# Patient Record
Sex: Male | Born: 1995 | Race: Black or African American | Hispanic: No | Marital: Single | Smoking: Current every day smoker
Health system: Southern US, Community
[De-identification: ages and names within clinical notes are randomized; demographics above are authoritative.]

## PROBLEM LIST (undated history)

## (undated) DIAGNOSIS — Z789 Other specified health status: Secondary | ICD-10-CM

## (undated) HISTORY — PX: NO PAST SURGERIES: SHX2092

---
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
## 2013-03-06 ENCOUNTER — Emergency Department (HOSPITAL_COMMUNITY)
Admission: EM | Admit: 2013-03-06 | Discharge: 2013-03-06 | Discharge status: Against Medical Advice | Payer: Medicaid Other | Attending: Emergency Medicine | Admitting: Emergency Medicine

## 2013-03-06 ENCOUNTER — Encounter (HOSPITAL_COMMUNITY): Payer: Self-pay | Admitting: Emergency Medicine

## 2013-03-06 DIAGNOSIS — IMO0001 Reserved for inherently not codable concepts without codable children: Secondary | ICD-10-CM | POA: Insufficient documentation

## 2013-03-06 DIAGNOSIS — M25539 Pain in unspecified wrist: Secondary | ICD-10-CM | POA: Insufficient documentation

## 2013-03-06 DIAGNOSIS — F17200 Nicotine dependence, unspecified, uncomplicated: Secondary | ICD-10-CM | POA: Insufficient documentation

## 2013-03-06 NOTE — ED Notes (Signed)
Called patient back to fast track x 2.

## 2013-03-06 NOTE — ED Notes (Signed)
Called patient back to FT x 1.

## 2013-03-06 NOTE — ED Notes (Signed)
Pt c/o generalized body aches and wrist pain x 3 weeks

## 2013-05-12 ENCOUNTER — Encounter (HOSPITAL_COMMUNITY): Payer: Self-pay | Admitting: Emergency Medicine

## 2013-05-12 ENCOUNTER — Emergency Department (HOSPITAL_COMMUNITY)
Admission: EM | Admit: 2013-05-12 | Discharge: 2013-05-12 | Disposition: A | Discharge status: Home / Self Care | Payer: Medicaid Other | Attending: Emergency Medicine | Admitting: Emergency Medicine

## 2013-05-12 DIAGNOSIS — F17200 Nicotine dependence, unspecified, uncomplicated: Secondary | ICD-10-CM | POA: Insufficient documentation

## 2013-05-12 DIAGNOSIS — R300 Dysuria: Secondary | ICD-10-CM | POA: Insufficient documentation

## 2013-05-12 LAB — URINALYSIS, ROUTINE W REFLEX MICROSCOPIC
Bilirubin Urine: NEGATIVE
Glucose, UA: NEGATIVE mg/dL
Ketones, ur: NEGATIVE mg/dL
Protein, ur: NEGATIVE mg/dL
pH: 7.5 (ref 5.0–8.0)

## 2013-05-12 NOTE — ED Provider Notes (Signed)
History     CSN: [id number]  Arrival date & time 05/12/13  1250   First MD Initiated Contact with Patient 05/12/13 1446      Chief Complaint  Patient presents with  . Exposure to STD    (Consider location/radiation/quality/duration/timing/severity/associated sxs/prior treatment) The history is provided by the patient.   Patient presents to ED for STD check. States he has had some tingling at the meatus. This occurs occasionally while urinating. He has unprotected sex, but is in a monogamous relationship.Denies discharge from the penis, testicular pain or swelling, genital lesions, inguinal lymphadenopathy,Fever, chills, abdominal pain, nausea, vomiting, rashes, joint swelling, visual changes, arthralgias or myalgias.  History reviewed. No pertinent past medical history.  No past surgical history on file.  No family history on file.  History  Substance Use Topics  . Smoking status: Current Every Day Smoker  . Smokeless tobacco: Not on file  . Alcohol Use: Yes     Comment: occ      Review of Systems As stated in HPI Allergies  Review of patient's allergies indicates no known allergies.  Home Medications  No current outpatient prescriptions on file.  BP 136/89  Pulse 63  Temp(Src) 98.2 F (36.8 C)  Resp 16  SpO2 99%  Physical Exam  Nursing note and vitals reviewed. Constitutional: He appears well-developed and well-nourished. No distress.  HENT:  Head: Normocephalic and atraumatic.  Eyes: Conjunctivae are normal. No scleral icterus.  Neck: Normal range of motion. Neck supple.  Cardiovascular: Normal rate, regular rhythm and normal heart sounds.   Pulmonary/Chest: Effort normal and breath sounds normal. No respiratory distress.  Abdominal: Soft. There is no tenderness. Hernia confirmed negative in the right inguinal area and confirmed negative in the left inguinal area.  Genitourinary: Testes normal. Cremasteric reflex is present. Circumcised. No hypospadias,  penile erythema or penile tenderness. No discharge found.  Musculoskeletal: He exhibits no edema.  Lymphadenopathy:       Right: No inguinal adenopathy present.       Left: No inguinal adenopathy present.  Neurological: He is alert.  Skin: Skin is warm and dry. He is not diaphoretic.  Psychiatric: His behavior is normal.    ED Course  Procedures (including critical care time)  Labs Reviewed  URINALYSIS, ROUTINE W REFLEX MICROSCOPIC - Abnormal; Notable for the following:    Urobilinogen, UA 2.0 (*)    All other components within normal limits  GC/CHLAMYDIA PROBE AMP   No results found.   1. Dysuria       MDM  Patient presents for check of STD. His Urine is negative for infection. G/C chlamydia pending. Discussed need for protection with sexual intercourse. F?U with health department for extended std testing.         Arthor Captain, PA-C 05/15/13 [phone]

## 2013-05-12 NOTE — ED Notes (Signed)
States that he has been exposed to std has occ tingle when he voids

## 2013-05-12 NOTE — ED Notes (Signed)
Patient states "nothing is wrong, I just wanted to be tested for any STD's".

## 2013-05-15 NOTE — ED Provider Notes (Signed)
Medical screening examination/treatment/procedure(s) were performed by non-physician practitioner and as supervising physician I was immediately available for consultation/collaboration.   Joya Gaskins, MD 05/15/13 2022

## 2013-11-23 ENCOUNTER — Encounter (HOSPITAL_COMMUNITY): Payer: Self-pay | Admitting: Emergency Medicine

## 2013-11-23 ENCOUNTER — Emergency Department (HOSPITAL_COMMUNITY)
Admission: EM | Admit: 2013-11-23 | Discharge: 2013-11-23 | Disposition: A | Discharge status: Home / Self Care | Payer: Medicaid Other | Attending: Emergency Medicine | Admitting: Emergency Medicine

## 2013-11-23 DIAGNOSIS — J039 Acute tonsillitis, unspecified: Secondary | ICD-10-CM | POA: Insufficient documentation

## 2013-11-23 DIAGNOSIS — F17200 Nicotine dependence, unspecified, uncomplicated: Secondary | ICD-10-CM | POA: Insufficient documentation

## 2013-11-23 LAB — RAPID STREP SCREEN (MED CTR MEBANE ONLY): Streptococcus, Group A Screen (Direct): NEGATIVE

## 2013-11-23 MED ORDER — IBUPROFEN 600 MG PO TABS: 600.0000 mg | ORAL_TABLET | Freq: Four times a day (QID) | ORAL | Status: DC | PRN

## 2013-11-23 MED ORDER — HYDROCODONE-ACETAMINOPHEN 7.5-325 MG/15ML PO SOLN
10.0000 mL | Freq: Once | ORAL | Status: AC
  Administered 2013-11-23: 10 mL via ORAL
  Filled 2013-11-23: qty 15

## 2013-11-23 MED ORDER — DEXAMETHASONE SODIUM PHOSPHATE 10 MG/ML IJ SOLN
10.0000 mg | Freq: Once | INTRAMUSCULAR | Status: AC
  Administered 2013-11-23: 10 mg via INTRAMUSCULAR
  Filled 2013-11-23: qty 1

## 2013-11-23 MED ORDER — CLINDAMYCIN HCL 150 MG PO CAPS: 300.0000 mg | ORAL_CAPSULE | Freq: Three times a day (TID) | ORAL | Status: DC

## 2013-11-23 MED ORDER — HYDROCODONE-ACETAMINOPHEN 7.5-325 MG/15ML PO SOLN: 15.0000 mL | Freq: Four times a day (QID) | ORAL | Status: DC | PRN

## 2013-11-23 NOTE — ED Provider Notes (Signed)
  Medical screening examination/treatment/procedure(s) were performed by non-physician practitioner and as supervising physician I was immediately available for consultation/collaboration.  EKG Interpretation   None          Tomeca Helm, MD 11/23/13 1627 

## 2013-11-23 NOTE — ED Notes (Signed)
Pt c/o sore throat x 3 days; pt denies fever 

## 2013-11-23 NOTE — ED Provider Notes (Signed)
CSN: [id number]     Arrival date & time 11/23/13  1522 History  This chart was scribed for non-physician practitioner Jaynie Crumble, PA-C, working with Gerhard Munch, MD by Dorothey Baseman, ED Scribe. This patient was seen in room TR08C/TR08C and the patient's care was started at 4:14 PM.    Chief Complaint  Patient presents with  . Sore Throat   The history is provided by the patient. No language interpreter was used.   HPI Comments: Ross Butler is a 17 y.o. male who presents to the Emergency Department complaining of a sore throat with associated swelling to the left side of the neck onset 2-3 days ago. Patient reports taking Tylenol at home without relief. He denies fever, congestion, cough, ear pain. He denies any sick contacts. Patient has no other pertinent medical history.   History reviewed. No pertinent past medical history. History reviewed. No pertinent past surgical history. History reviewed. No pertinent family history. History  Substance Use Topics  . Smoking status: Current Every Day Smoker  . Smokeless tobacco: Not on file  . Alcohol Use: Yes     Comment: occ    Review of Systems  Constitutional: Negative for fever.  HENT: Positive for sore throat. Negative for congestion and ear pain.   Respiratory: Negative for cough.   Hematological: Positive for adenopathy.  All other systems reviewed and are negative.    Allergies  Review of patient's allergies indicates no known allergies.  Home Medications  No current outpatient prescriptions on file.  Triage Vitals: BP 135/60  Pulse 72  Temp(Src) 98.3 F (36.8 C) (Oral)  Resp 20  SpO2 100%  Physical Exam  Nursing note and vitals reviewed. Constitutional: He is oriented to person, place, and time. He appears well-developed and well-nourished. No distress.  HENT:  Head: Normocephalic and atraumatic.  Right Ear: Hearing, tympanic membrane, external ear and ear canal normal.  Left Ear: Hearing, tympanic  membrane, external ear and ear canal normal.  Nose: Nose normal.  Bilaterally enlarged tonsils, left slightly larger. Uvula is midline. Left sub mandibular and bilateral anterior cervical swollen and tender lymph nodes.   Eyes: Conjunctivae are normal.  Neck: Normal range of motion. Neck supple.  Pulmonary/Chest: Effort normal. No respiratory distress.  Abdominal: He exhibits no distension.  Musculoskeletal: Normal range of motion.  Neurological: He is alert and oriented to person, place, and time.  Skin: Skin is warm and dry.  Psychiatric: He has a normal mood and affect. His behavior is normal.    ED Course  Procedures (including critical care time)\  DIAGNOSTIC STUDIES: Oxygen Saturation is 100% on room air, normal by my interpretation.    COORDINATION OF CARE: 4:16 PM- Discussed that strep test was negative. Will discharge patient with antibiotics and pain medication to manage symptoms. Advised patient to use warm salt water gargles and ibuprofen at home as needed. Advised patient to return to the ED if there are any new or worsening symptoms, especially persistent tonsillar swelling or other signs of abscess. Discussed treatment plan with patient at bedside and patient verbalized agreement.     Labs Review Labs Reviewed  RAPID STREP SCREEN  CULTURE, GROUP A STREP   Imaging Review No results found.  EKG Interpretation   None       MDM   1. Tonsillitis     Patient with a silk throat and no other complaints. Based on Center criteria will start on antibiotics. On herexam today no signs of peritonsilar or retropharyngeal abscess.  Bilateral tonsillar swelling with left tonsil greater than the right however uvula is midline. Pt is tolerating POs. He is afebrile. Given decadron and hycet for pain in ED. Home with clindamycin, ibuprofen, hycet. Given precautions to return if worsening.   Filed Vitals:   11/23/13 1525  BP: 135/60  Pulse: 72  Temp: 98.3 F (36.8 C)   TempSrc: Oral  Resp: 20  SpO2: 100%     I personally performed the services described in this documentation, which was scribed in my presence. The recorded information has been reviewed and is accurate.     Lottie Mussel, PA-C 11/23/13 1627

## 2013-11-25 LAB — CULTURE, GROUP A STREP

## 2014-01-11 ENCOUNTER — Encounter (HOSPITAL_COMMUNITY): Payer: Self-pay | Admitting: Emergency Medicine

## 2014-01-11 ENCOUNTER — Emergency Department (HOSPITAL_COMMUNITY)
Admission: EM | Admit: 2014-01-11 | Discharge: 2014-01-11 | Disposition: A | Discharge status: Home / Self Care | Payer: Medicaid Other | Attending: Emergency Medicine | Admitting: Emergency Medicine

## 2014-01-11 DIAGNOSIS — S46909A Unspecified injury of unspecified muscle, fascia and tendon at shoulder and upper arm level, unspecified arm, initial encounter: Secondary | ICD-10-CM | POA: Diagnosis not present

## 2014-01-11 DIAGNOSIS — S8990XA Unspecified injury of unspecified lower leg, initial encounter: Secondary | ICD-10-CM | POA: Diagnosis not present

## 2014-01-11 DIAGNOSIS — Y92410 Unspecified street and highway as the place of occurrence of the external cause: Secondary | ICD-10-CM | POA: Insufficient documentation

## 2014-01-11 DIAGNOSIS — F17200 Nicotine dependence, unspecified, uncomplicated: Secondary | ICD-10-CM | POA: Insufficient documentation

## 2014-01-11 DIAGNOSIS — S4980XA Other specified injuries of shoulder and upper arm, unspecified arm, initial encounter: Secondary | ICD-10-CM | POA: Diagnosis present

## 2014-01-11 DIAGNOSIS — S99919A Unspecified injury of unspecified ankle, initial encounter: Secondary | ICD-10-CM

## 2014-01-11 DIAGNOSIS — Y9389 Activity, other specified: Secondary | ICD-10-CM | POA: Insufficient documentation

## 2014-01-11 DIAGNOSIS — M25511 Pain in right shoulder: Secondary | ICD-10-CM

## 2014-01-11 DIAGNOSIS — S99929A Unspecified injury of unspecified foot, initial encounter: Secondary | ICD-10-CM

## 2014-01-11 DIAGNOSIS — M25561 Pain in right knee: Secondary | ICD-10-CM

## 2014-01-11 MED ORDER — NAPROXEN 500 MG PO TABS: 500.0000 mg | ORAL_TABLET | Freq: Two times a day (BID) | ORAL | Status: DC

## 2014-01-11 MED ORDER — METHOCARBAMOL 500 MG PO TABS: 500.0000 mg | ORAL_TABLET | Freq: Two times a day (BID) | ORAL | Status: DC
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
## 2014-01-12 NOTE — ED Provider Notes (Signed)
Medical screening examination/treatment/procedure(s) were performed by non-physician practitioner and as supervising physician I was immediately available for consultation/collaboration.  EKG Interpretation   None        Tinika Bucknam, MD 01/12/14 0039 

## 2015-04-19 ENCOUNTER — Emergency Department (HOSPITAL_COMMUNITY)
Admission: EM | Admit: 2015-04-19 | Discharge: 2015-04-19 | Disposition: A | Discharge status: Home / Self Care | Payer: Medicaid Other | Attending: Emergency Medicine | Admitting: Emergency Medicine

## 2015-04-19 ENCOUNTER — Encounter (HOSPITAL_COMMUNITY): Payer: Self-pay

## 2015-04-19 DIAGNOSIS — Z720 Tobacco use: Secondary | ICD-10-CM | POA: Insufficient documentation

## 2015-04-19 DIAGNOSIS — R21 Rash and other nonspecific skin eruption: Secondary | ICD-10-CM | POA: Diagnosis present

## 2015-04-19 DIAGNOSIS — R079 Chest pain, unspecified: Secondary | ICD-10-CM

## 2015-04-19 DIAGNOSIS — R0789 Other chest pain: Secondary | ICD-10-CM | POA: Insufficient documentation

## 2015-04-19 DIAGNOSIS — A5139 Other secondary syphilis of skin: Secondary | ICD-10-CM | POA: Diagnosis not present

## 2015-04-19 DIAGNOSIS — A5149 Other secondary syphilitic conditions: Secondary | ICD-10-CM

## 2015-04-19 LAB — RPR: RPR Ser Ql: NONREACTIVE

## 2015-04-19 LAB — GC/CHLAMYDIA PROBE AMP (~~LOC~~) NOT AT ARMC
Chlamydia: POSITIVE — AB
Neisseria Gonorrhea: NEGATIVE

## 2015-04-19 LAB — RAPID HIV SCREEN (HIV 1/2 AB+AG)
HIV 1/2 Antibodies: NONREACTIVE
HIV-1 P24 ANTIGEN - HIV24: NONREACTIVE

## 2015-04-19 MED ORDER — PENICILLIN G BENZATHINE & PROC 1200000 UNIT/2ML IM SUSP
2.4000 10*6.[IU] | Freq: Once | INTRAMUSCULAR | Status: AC
  Administered 2015-04-19: 2.4 10*6.[IU] via INTRAMUSCULAR
  Filled 2015-04-19: qty 4
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
## 2015-04-19 NOTE — Discharge Instructions (Signed)
Please follow up with the health department. Specifically, there is a STD clinic, and see if they have any more educational information for you.   Chest Pain (Nonspecific) It is often hard to give a diagnosis for the cause of chest pain. There is always a chance that your pain could be related to something serious, such as a heart attack or a blood clot in the lungs. You need to follow up with your doctor. HOME CARE  If antibiotic medicine was given, take it as directed by your doctor. Finish the medicine even if you start to feel better.  For the next few days, avoid activities that bring on chest pain. Continue physical activities as told by your doctor.  Do not use any tobacco products. This includes cigarettes, chewing tobacco, and e-cigarettes.  Avoid drinking alcohol.  Only take medicine as told by your doctor.  Follow your doctor's suggestions for more testing if your chest pain does not go away.  Keep all doctor visits you made. GET HELP IF:  Your chest pain does not go away, even after treatment.  You have a rash with blisters on your chest.  You have a fever. GET HELP RIGHT AWAY IF:   You have more pain or pain that spreads to your arm, neck, jaw, back, or belly (abdomen).  You have shortness of breath.  You cough more than usual or cough up blood.  You have very bad back or belly pain.  You feel sick to your stomach (nauseous) or throw up (vomit).  You have very bad weakness.  You pass out (faint).  You have chills. This is an emergency. Do not wait to see if the problems will go away. Call your local emergency services (911 in [place] Do not drive yourself to the hospital. MAKE SURE YOU:   Understand these instructions.  Will watch your condition.  Will get help right away if you are not doing well or get worse. Document Released: 05/18/2008 Document Revised: 12/05/2013 Document Reviewed: 05/18/2008 [facility] Patient Information 2015 [place], [place].  This information is not intended to replace advice given to you by your health care provider. Make sure you discuss any questions you have with your health care provider.   RESOURCE GUIDE  Chronic Pain Problems: Contact [facility]  [phone] Patients need to be referred by their primary care doctor.  Insufficient Money for Medicine: Contact United Way:  call "211."   No Primary Care Doctor: - Call Health Connect  [phone] - can help you locate a primary care doctor that  accepts your insurance, provides certain services, etc. - Physician Referral Service- [phone]  Agencies that provide inexpensive medical care: - [facility]  [phone] - [facility]  [phone] - [facility]  [phone] - Women's Clinic  [phone] - Planned Parenthood  [phone] [facility]  [phone]  Medicaid-accepting [facility] Providers: - [facility]- [address]  [phone], Mon-Fri 9am-7pm, Sat 9am-1pm - [facility]- [address] [place], [address] [place]  [phone] - [facility]- [address], [address] [place]  [phone] [facility]- [address]  [phone] - Renaye Rakers- [address] [place], [address], [phone]  Only accepts [place] patients after they have their name  applied to their card  Self Pay (no insurance) in [place]: - Sickle Cell Patients: Dr Willey Blade, [facility]  [address], [phone] - [facility]- [address]  [phone]       Patrcia Dolly-     [facility] [place]- [address], [address]       -     [facility]- see information above (Speak to [employer]am H if you do not have insurance)       -  [facility] [place]- [address],  [phone]       -  [facility]- [address] [place] [address], [phone]       -  Dr Julio Sickssei-Bonsu-  [address], [address], [place], [phone]       -  [facility] - [address], [phone]       -  [facility]- [address] [place] [address], [phone], also [address], [phone]       -    [facility]- [address], [phone], 1st & 3rd Saturday        every month, 10am-1pm  [facility] [address] [place], [place] [address] [phone]  [facility] [address] [place], [place] [address] [phone]  1) Find a Doctor and Pay Out of Pocket Although you won't have to find out who is covered by your insurance plan, it is a good idea to ask around and get recommendations. You will then need to call the office and see if the doctor you have chosen will accept you as a new patient and what types of options they offer for patients who are self-pay. Some doctors offer discounts or will set up payment plans for their patients who do not have insurance, but you will need to ask so you aren't surprised when you get to your appointment.  2) Contact Your Local Health Department Not all health departments have doctors that can see patients for sick visits, but many do, so it is worth a call to see if yours does. If you don't know where your local health department is, you can check in your phone book. The CDC also has a tool to help you locate your state's health department, and many state websites also have listings of all of their local health departments.  3) Find a Walk-in Clinic If your illness is not likely to be very severe or complicated, you may want to try a walk in clinic. These are popping up all over the country in pharmacies, drugstores, and shopping centers. They're usually staffed by nurse practitioners or physician assistants that have been trained to treat common illnesses and complaints. They're usually fairly quick and inexpensive. However, if you have serious medical issues or  chronic medical problems, these are probably not your best option  STD Testing - [facility] [place], STD Clinic, [address], [place], phone [phone] or [phone].  Monday - Friday, call for an appointment. [facility] [place] [facility] [place], STD Clinic, [address], [place], phone [phone] or [phone].  Monday - Friday, call for an appointment.  Abuse/Neglect: [facility] [place] Child Abuse Hotline [phone] 5341017566 [facility]- [place] Child Abuse Hotline 873-238-5770248 [phone] (After Hours)  Emergency Shelter:  [facility] [phone]) [phone]  Maternity Homes: - [facility] [phone] - [facility] [phone]  MRSA Hotline #:  161-0960(647)212-9735  Dental Assistance If unable to pay or uninsured, contact:  [facility] to become qualified for the adult dental clinic.  Patients with Medicaid: [facility] [phone], [phone] [address], [phone]  If unable to pay, or uninsured, contact [facility] [phone] in [place], [phone] in [facility]) to become qualified for the adult dental clinic  [facility] [address] [place], [place] [address] [phone] [email or web address]  Other [facility] Services: - [facility]- [address], [place], [place], [address], [phone], [address]. [phone], 2nd and 4th Thursday of the month at 6:30am.  10 clients each day by appointment, can sometimes see walk-in patients if someone does not show for an appointment. [facility]- [address], [place], [place], [address], [phone] - [facility]- [address], [place], [place], [address], [phone] [facility]- [place] [facility]- [phone] [facility] [place]- [phone] [phone]-  [phone]            Syphilis Syphilis is an infectious disease. It can cause serious complications if left untreated.  CAUSES  Syphilis is caused by a type of bacteria called Treponema pallidum. It is most commonly spread through sexual contact. Syphilis may also spread to a fetus through the blood of the mother.  SIGNS AND SYMPTOMS Symptoms vary depending on the stage of the disease. Some symptoms may disappear without treatment. However, this does not mean that the infection is gone. One form of syphilis (called latent syphilis) has no symptoms.  Primary Syphilis  Painless sores (chancres) in and around the genital organs and mouth.  Swollen lymph nodes near the sores. Secondary Syphilis  A rash or sores over any portion of the body, including the palms of the hands and soles of the feet.  Fever.  Headache.  Sore throat.  Swollen lymph nodes.  New sores in the mouth or on the genitals.  Feeling generally ill.  Having pain in the joints. Tertiary Syphilis The third stage of syphilis involves severe damage to different organs in the body, such as the brain, spinal cord, and heart. Signs and symptoms may include:   Dementia.  Personality and mood changes.  Difficulty walking.  Heart failure.  Fainting.  Enlargement (aneurysm) of the aorta.  Tumors of the skin, bones, or liver.  Muscle weakness.  Sudden "lightning" pains, numbness, or tingling.  Problems with coordination.  Vision changes. DIAGNOSIS   A physical exam will be done.  Blood tests will be done to confirm the diagnosis.  If the disease is in the first or second stages, a fluid (drainage) sample from a sore or rash may be examined under a microscope to detect the disease-causing bacteria.  Fluid around the spine may need to be examined to detect brain damage or inflammation of the brain lining (meningitis).  If the disease is in the third stage, X-rays, CT scans, MRIs, echocardiograms,  ultrasounds, or cardiac catheterization may also be done to detect disease of the heart, aorta, or brain. TREATMENT  Syphilis can be cured with antibiotic medicine if a diagnosis is made early. During the first day of treatment, you may experience fever, chills, headache, nausea, or aching all over your body. This is a normal reaction to the antibiotics.  HOME CARE INSTRUCTIONS   Take your antibiotic medicine as directed by your health care provider. Finish the antibiotic even if you start to feel better.  Incomplete treatment will put you at risk for continued infection and could be life threatening.  Take medicines only as directed by your health care provider.  Do not have sexual intercourse until your treatment is completed or as directed by your health care provider.  Inform your recent sexual partners that you were diagnosed with syphilis. They need to seek care and treatment, even if they have no symptoms. It is necessary that all your sexual partners be tested for infection and treated if they have the disease.  Keep all follow-up visits as directed by your health care provider. It is important to keep all your appointments.  If your test results are not ready during your visit, make an appointment with your health care provider to find out the results. Do not assume everything is normal if you have not heard from your health care provider or the medical facility. It is your responsibility to get your test results. SEEK MEDICAL CARE IF:  You continue to have any of the following 24 hours after beginning treatment:  Fever.  Chills.  Headache.  Nausea.  Aching all over your body.  You have symptoms of an allergic reaction to medicine, such as:  Chills.  A headache.  Light-headedness.  A new rash (especially hives).  Difficulty breathing. MAKE SURE YOU:   Understand these instructions.  Will watch your condition.  Will get help right away if you are not doing well  or get worse. Document Released: 09/20/2013 Document Revised: 04/16/2014 Document Reviewed: 09/20/2013 [facility] Patient Information 2015 [place], [place]. This information is not intended to replace advice given to you by your health care provider. Make sure you discuss any questions you have with your health care provider.

## 2015-04-22 ENCOUNTER — Telehealth (HOSPITAL_COMMUNITY): Payer: Self-pay

## 2015-04-22 NOTE — ED Notes (Signed)
Positive for Chlamydia.  Chart sent to EDP office for review 

## 2015-04-26 ENCOUNTER — Telehealth (HOSPITAL_COMMUNITY): Payer: Self-pay | Admitting: *Deleted

## 2015-04-27 ENCOUNTER — Telehealth: Payer: Self-pay | Admitting: Emergency Medicine

## 2015-04-27 NOTE — Telephone Encounter (Signed)
ID verified, patient notified of positive Chlamydia result and need for treatment. Patient states that he has been notified and treated by his PMD with Azithromycin. STD instructions provided, patient verbalized understanding.

## 2015-12-27 ENCOUNTER — Emergency Department (HOSPITAL_COMMUNITY)
Admission: EM | Admit: 2015-12-27 | Discharge: 2015-12-27 | Discharge status: Absent without leave | Payer: No Typology Code available for payment source

## 2016-01-02 ENCOUNTER — Encounter (HOSPITAL_COMMUNITY): Payer: Self-pay | Admitting: *Deleted

## 2016-01-02 ENCOUNTER — Emergency Department (HOSPITAL_COMMUNITY)
Admission: EM | Admit: 2016-01-02 | Discharge: 2016-01-02 | Disposition: A | Discharge status: Home / Self Care | Payer: Medicaid Other | Attending: Emergency Medicine | Admitting: Emergency Medicine

## 2016-01-02 DIAGNOSIS — X500XXA Overexertion from strenuous movement or load, initial encounter: Secondary | ICD-10-CM | POA: Insufficient documentation

## 2016-01-02 DIAGNOSIS — Y929 Unspecified place or not applicable: Secondary | ICD-10-CM | POA: Insufficient documentation

## 2016-01-02 DIAGNOSIS — Z7251 High risk heterosexual behavior: Secondary | ICD-10-CM | POA: Insufficient documentation

## 2016-01-02 DIAGNOSIS — Y990 Civilian activity done for income or pay: Secondary | ICD-10-CM | POA: Insufficient documentation

## 2016-01-02 DIAGNOSIS — T148XXA Other injury of unspecified body region, initial encounter: Secondary | ICD-10-CM

## 2016-01-02 DIAGNOSIS — Z113 Encounter for screening for infections with a predominantly sexual mode of transmission: Secondary | ICD-10-CM | POA: Insufficient documentation

## 2016-01-02 DIAGNOSIS — F17200 Nicotine dependence, unspecified, uncomplicated: Secondary | ICD-10-CM | POA: Insufficient documentation

## 2016-01-02 DIAGNOSIS — Y9389 Activity, other specified: Secondary | ICD-10-CM | POA: Insufficient documentation

## 2016-01-02 DIAGNOSIS — S46911A Strain of unspecified muscle, fascia and tendon at shoulder and upper arm level, right arm, initial encounter: Secondary | ICD-10-CM | POA: Insufficient documentation

## 2016-01-02 MED ORDER — METRONIDAZOLE 500 MG PO TABS: 2000.0000 mg | ORAL_TABLET | Freq: Once | ORAL | Status: DC

## 2016-01-02 MED ORDER — NAPROXEN 500 MG PO TABS: 500.0000 mg | ORAL_TABLET | Freq: Two times a day (BID) | ORAL | Status: DC

## 2016-01-02 MED ORDER — CEFTRIAXONE SODIUM 250 MG IJ SOLR
250.0000 mg | Freq: Once | INTRAMUSCULAR | Status: DC
Start: 2016-01-02 — End: 2016-01-02

## 2016-01-02 MED ORDER — AZITHROMYCIN 250 MG PO TABS: 1000.0000 mg | ORAL_TABLET | Freq: Once | ORAL | Status: DC

## 2016-01-02 NOTE — ED Provider Notes (Signed)
CSN: [id number]     Arrival date & time 01/02/16  1232 History  By signing my name below, I, Essence Howell, attest that this documentation has been prepared under the direction and in the presence of Arthor Captain, PA-C Electronically Signed: Charline Bills, ED Scribe 01/02/2016 at 1:47 PM.   Chief Complaint  Patient presents with  . Arm Injury  . Exposure to STD   The history is provided by the patient. No language interpreter was used.   HPI Comments: Ross Butler is a 20 y.o. male who presents to the Emergency Department complaining of constant left arm pain onset 3 weeks, worsened yesterday. Pt states that he pulled a muscle in his right arm yesterday while at work. He reports repetitive heavy lifting at work. Pain is exacerbated with movement. No treatments tried PTA. Pt denies joint swelling, shoulder pain, neck pain, weakness.   Pt also presents for STD screening. Pt is sexually active. States that him and his partner typically use protection but did not during his last encounter. Pt denies penile discharge, penile pain, penile swelling and any other symptoms at this time. He also denies known exposure to STD.   History reviewed. No pertinent past medical history. History reviewed. No pertinent past surgical history. History reviewed. No pertinent family history. Social History  Substance Use Topics  . Smoking status: Current Every Day Smoker  . Smokeless tobacco: None  . Alcohol Use: Yes     Comment: occ    Review of Systems  Genitourinary: Negative for discharge, penile swelling and penile pain.  Musculoskeletal: Positive for myalgias. Negative for joint swelling, arthralgias and neck pain.   Allergies  Review of patient's allergies indicates no known allergies.  Home Medications   Prior to Admission medications   Medication Sig Start Date End Date Taking? Authorizing Provider  methocarbamol (ROBAXIN) 500 MG tablet Take 1 tablet (500 mg total) by mouth 2 (two)  times daily. Patient not taking: Reported on 04/19/2015 01/11/14   Dahlia Client Muthersbaugh, PA-C  naproxen (NAPROSYN) 500 MG tablet Take 1 tablet (500 mg total) by mouth 2 (two) times daily with a meal. Patient not taking: Reported on 04/19/2015 01/11/14   Dahlia Client Muthersbaugh, PA-C   BP 137/57 mmHg  Pulse 66  Temp(Src) 98.3 F (36.8 C) (Oral)  Resp 16  Ht  (1.727 m)  Wt 148 lb (67.132 kg)  BMI 22.51 kg/m2  SpO2 100% Physical Exam  Constitutional: He is oriented to person, place, and time. He appears well-developed and well-nourished. No distress.  HENT:  Head: Normocephalic and atraumatic.  Eyes: Conjunctivae and EOM are normal.  Neck: Neck supple. No tracheal deviation present.  Cardiovascular: Normal rate.   Pulmonary/Chest: Effort normal. No respiratory distress.  Genitourinary: Right testis shows no mass, no swelling and no tenderness. Left testis shows no mass, no swelling and no tenderness. Circumcised. No discharge found.  No swelling to the scrotum or penis. No discharge. No testicular tenderness. No masses palpated. No lesions or rashes.   Musculoskeletal: Normal range of motion.  L biceps tenderness. Full strength and ROM. Tender on the medial side of the arm. Pain in the triceps area. No venous distension. No swelling. NVI. Full grip strength. No obvious deformity.   Neurological: He is alert and oriented to person, place, and time.  Skin: Skin is warm and dry.  Psychiatric: He has a normal mood and affect. His behavior is normal.  Nursing note and vitals reviewed.  ED Course  Procedures (including critical care  time) DIAGNOSTIC STUDIES: Oxygen Saturation is 100% on RA, normal by my interpretation.    COORDINATION OF CARE:  1:38 PM-Discussed treatment plan which includes STD screening, Naproxen, Rocephin, Flagyl and azithromycin with pt at bedside and pt agreed to plan.   Labs Review Labs Reviewed  RPR  HIV ANTIBODY (ROUTINE TESTING)  GC/CHLAMYDIA PROBE AMP ([facility]) NOT AT [facility]   Imaging Review No results found. I have personally reviewed and evaluated these images and lab results as part of my medical decision-making.   EKG Interpretation None      MDM   Final diagnoses:  Muscle strain  Unprotected sexual intercourse   No XRs indicated at this time. Pain managed in ED. Conservative therapy recommended and discussed. Patient will be dc home & is agreeable with above plan.  Pt arrives for asymptomatic STD check. Discussed safe sexual practices. Pt is advised to follow up for free testing at local health department in the future. Pt appears safe for discharge.   I personally performed the services described in this documentation, which was scribed in my presence. The recorded information has been reviewed and is accurate.       Arthor Captain, PA-C 01/02/16 1557  Alvira Monday, MD 01/02/16 [phone]

## 2016-01-02 NOTE — ED Notes (Signed)
Pt reports he picks up heavy objects at work  Pt reports Lt Arm pain started at work today.   Pt denies any SX of STD just wanted a routine STD check up.

## 2016-01-02 NOTE — Discharge Instructions (Signed)
Safe Sex  Safe sex is about reducing the risk of giving or getting a sexually transmitted disease (STD). STDs are spread through sexual contact involving the genitals, mouth, or rectum. Some STDs can be cured and others cannot. Safe sex can also prevent unintended pregnancies.   WHAT ARE SOME SAFE SEX PRACTICES?  · Limit your sexual activity to only one partner who is having sex with only you.  · Talk to your partner about his or her past partners, past STDs, and drug use.  · Use a condom every time you have sexual intercourse. This includes vaginal, oral, and anal sexual activity. Both females and males should wear condoms during oral sex. Only use latex or polyurethane condoms and water-based lubricants. Using petroleum-based lubricants or oils to lubricate a condom will weaken the condom and increase the chance that it will break. The condom should be in place from the beginning to the end of sexual activity. Wearing a condom reduces, but does not completely eliminate, your risk of getting or giving an STD. STDs can be spread by contact with infected body fluids and skin.  · Get vaccinated for hepatitis B and HPV.  · Avoid alcohol and recreational drugs, which can affect your judgment. You may forget to use a condom or participate in high-risk sex.  · For females, avoid douching after sexual intercourse. Douching can spread an infection farther into the reproductive tract.  · Check your body for signs of sores, blisters, rashes, or unusual discharge. See your health care provider if you notice any of these signs.  · Avoid sexual contact if you have symptoms of an infection or are being treated for an STD. If you or your partner has herpes, avoid sexual contact when blisters are present. Use condoms at all other times.  · If you are at risk of being infected with HIV, it is recommended that you take a prescription medicine daily to prevent HIV infection. This is called pre-exposure prophylaxis (PrEP). You are  considered at risk if:    You are a man who has sex with other men (MSM).    You are a heterosexual man or woman who is sexually active with more than one partner.    You take drugs by injection.    You are sexually active with a partner who has HIV.  · Talk with your health care provider about whether you are at high risk of being infected with HIV. If you choose to begin PrEP, you should first be tested for HIV. You should then be tested every 3 months for as long as you are taking PrEP.  · See your health care provider for regular screenings, exams, and tests for other STDs. Before having sex with a new partner, each of you should be screened for STDs and should talk about the results with each other.  WHAT ARE THE BENEFITS OF SAFE SEX?   · There is less chance of getting or giving an STD.  · You can prevent unwanted or unintended pregnancies.  · By discussing safe sex concerns with your partner, you may increase feelings of intimacy, comfort, trust, and honesty between the two of you.     This information is not intended to replace advice given to you by your health care provider. Make sure you discuss any questions you have with your health care provider.     Document Released: 01/07/2005 Document Revised: 12/21/2014 Document Reviewed: 05/23/2012  Elsevier Interactive Patient Education ©2016 Elsevier Inc.

## 2016-01-03 LAB — HIV ANTIBODY (ROUTINE TESTING W REFLEX): HIV SCREEN 4TH GENERATION: NONREACTIVE

## 2016-01-03 LAB — GC/CHLAMYDIA PROBE AMP (~~LOC~~) NOT AT ARMC
CHLAMYDIA, DNA PROBE: NEGATIVE
Neisseria Gonorrhea: NEGATIVE

## 2016-01-03 LAB — RPR: RPR Ser Ql: NONREACTIVE

## 2016-07-21 ENCOUNTER — Emergency Department (HOSPITAL_COMMUNITY)
Admission: EM | Admit: 2016-07-21 | Discharge: 2016-07-21 | Disposition: A | Discharge status: Home / Self Care | Payer: Medicaid Other | Attending: Emergency Medicine | Admitting: Emergency Medicine

## 2016-07-21 ENCOUNTER — Encounter (HOSPITAL_COMMUNITY): Payer: Self-pay | Admitting: Vascular Surgery

## 2016-07-21 DIAGNOSIS — F17200 Nicotine dependence, unspecified, uncomplicated: Secondary | ICD-10-CM | POA: Insufficient documentation

## 2016-07-21 DIAGNOSIS — Z711 Person with feared health complaint in whom no diagnosis is made: Secondary | ICD-10-CM

## 2016-07-21 DIAGNOSIS — R300 Dysuria: Secondary | ICD-10-CM | POA: Insufficient documentation

## 2016-07-21 DIAGNOSIS — Z202 Contact with and (suspected) exposure to infections with a predominantly sexual mode of transmission: Secondary | ICD-10-CM | POA: Insufficient documentation

## 2016-07-21 LAB — URINALYSIS, ROUTINE W REFLEX MICROSCOPIC
Bilirubin Urine: NEGATIVE
GLUCOSE, UA: NEGATIVE mg/dL
HGB URINE DIPSTICK: NEGATIVE
Ketones, ur: 15 mg/dL — AB
Nitrite: NEGATIVE
PH: 7.5 (ref 5.0–8.0)
Protein, ur: NEGATIVE mg/dL
SPECIFIC GRAVITY, URINE: 1.027 (ref 1.005–1.030)

## 2016-07-21 LAB — URINE MICROSCOPIC-ADD ON

## 2016-07-21 MED ORDER — AZITHROMYCIN 1 G PO PACK
1.0000 g | PACK | Freq: Once | ORAL | Status: AC
  Administered 2016-07-21: 1 g via ORAL
  Filled 2016-07-21: qty 1

## 2016-07-21 MED ORDER — CEFTRIAXONE SODIUM 250 MG IJ SOLR
250.0000 mg | Freq: Once | INTRAMUSCULAR | Status: AC
  Administered 2016-07-21: 250 mg via INTRAMUSCULAR
  Filled 2016-07-21: qty 250

## 2016-07-21 MED ORDER — LIDOCAINE HCL (PF) 1 % IJ SOLN
INTRAMUSCULAR | Status: AC
  Administered 2016-07-21: 1 mL
  Filled 2016-07-21: qty 5

## 2016-07-21 NOTE — ED Triage Notes (Signed)
Pt reports to the ED for eval of low back pain. Denies any injury. Denies any numbness, tingling, paralysis, or bowel or bladder changes. Also requesting STD check. Pt reports unprotected sex but denies any symptoms. Pt A&Ox4, resp e/u, and skin warm and dry.

## 2016-07-21 NOTE — ED Provider Notes (Signed)
[facility]-EMERGENCY DEPT Provider Note   CSN: [id number] Arrival date & time: 07/21/16  1633  First Provider Contact:  None    By signing my name below, I, Majel Homer, attest that this documentation has been prepared under the direction and in the presence of non-physician practitioner, Kerrie Buffalo, NP. Electronically Signed: Majel Homer, Scribe. 07/21/2016. 6:09 PM.  History   Chief Complaint Chief Complaint  Patient presents with  . Back Pain  . Exposure to STD   The history is provided by the patient. No language interpreter was used.   HPI Comments: Ross Butler is a 20 y.o. male who presents to the Emergency Department complaining of intermittent, lower back pain that began 3 weeks ago. He states his pain is relieved when drinking a lot of water. He notes he has not taken any medication to relieve his pain as he was told by his mom that it was probably due to kidney issues. Pt also complains of gradually worsening, dysuria that began 1 week ago and worsened today. He reports 1 incident of unprotected sex on 8/1 with a partner he has been off and on with 1 year; he notes she may have had other partners since the last time they were together. He denies any new sexual partners or penile discharge.   History reviewed. No pertinent past medical history.  There are no active problems to display for this patient.  History reviewed. No pertinent surgical history.  Home Medications    Prior to Admission medications   Medication Sig Start Date End Date Taking? Authorizing Provider  methocarbamol (ROBAXIN) 500 MG tablet Take 1 tablet (500 mg total) by mouth 2 (two) times daily. Patient not taking: Reported on 04/19/2015 01/11/14   Dahlia Client Muthersbaugh, PA-C  naproxen (NAPROSYN) 500 MG tablet Take 1 tablet (500 mg total) by mouth 2 (two) times daily with a meal. 01/02/16   Arthor Captain, PA-C    Family History No family history on file.  Social History Social History  Substance Use Topics    . Smoking status: Current Every Day Smoker  . Smokeless tobacco: Never Used  . Alcohol use Yes     Comment: occ    Allergies   Review of patient's allergies indicates no known allergies.  Review of Systems Review of Systems  Genitourinary: Positive for dysuria. Negative for discharge.  Musculoskeletal: Positive for back pain.  all other systems negative Physical Exam Updated Vital Signs BP 115/61 (BP Location: Right Arm)   Pulse 61   Temp 99.1 F (37.3 C) (Oral)   Resp 16   SpO2 100%   Physical Exam  Constitutional: He is oriented to person, place, and time. He appears well-developed and well-nourished.  HENT:  Head: Normocephalic and atraumatic.  Eyes: Conjunctivae and EOM are normal.  Neck: Normal range of motion. Neck supple.  Cardiovascular: Normal rate and regular rhythm.   Pulmonary/Chest: Effort normal and breath sounds normal. No stridor.  Abdominal: Soft. Bowel sounds are normal. There is no tenderness. There is no CVA tenderness.  Genitourinary: Testes normal. Circumcised. No penile erythema or penile tenderness. No discharge found.  Genitourinary Comments: Chaperone was present for exam which was performed with no discomfort or complications.   Musculoskeletal: Normal range of motion. He exhibits no edema.  Full ROM of back with no pain  No CVA tenderness Distal pulses intact, no lower extremity edema   Lymphadenopathy: Inguinal adenopathy noted on the right and left side.  Neurological: He is alert and oriented to person,  place, and time. Coordination normal.  Skin: Skin is warm and dry.  Psychiatric: He has a normal mood and affect.  Nursing note and vitals reviewed.  ED Treatments / Results  Labs (all labs ordered are listed, but only abnormal results are displayed) Labs Reviewed  URINALYSIS, ROUTINE W REFLEX MICROSCOPIC (NOT [facility]) - Abnormal; Notable for the following:       Result Value   Ketones, ur 15 (*)    Leukocytes, UA SMALL (*)    All  other components within normal limits  URINE MICROSCOPIC-ADD ON - Abnormal; Notable for the following:    Squamous Epithelial / LPF 0-5 (*)    Bacteria, UA RARE (*)    All other components within normal limits  RPR  HIV ANTIBODY (ROUTINE TESTING)  GC/CHLAMYDIA PROBE AMP ([place]) NOT AT [facility]   GC Chlamydia cultures pending Radiology No results found.  Procedures Procedures  DIAGNOSTIC STUDIES:  Oxygen Saturation is 100% on RA, normal by my interpretation.    COORDINATION OF CARE:  6:05 PM Discussed treatment plan, which includes urinalysis and cultures for GC and Chlamydia, RPR, HIV, Rocephin and Zithromax.patient agreed to plan.  Medications Ordered in ED Medications  cefTRIAXone (ROCEPHIN) injection 250 mg (250 mg Intramuscular Given 07/21/16 1817)  azithromycin (ZITHROMAX) powder 1 g (1 g Oral Given 07/21/16 1838)  lidocaine (PF) (XYLOCAINE) 1 % injection (1 mL  Given 07/21/16 1818)    Initial Impression / Assessment and Plan / ED Course  I have reviewed the triage vital signs and the nursing notes.  Pertinent lab results that were available during my care of the patient were reviewed by me and considered in my medical decision making (see chart for details).  Clinical Course    I personally performed the services described in this documentation, which was scribed in my presence. The recorded information has been reviewed and is accurate.   Final Clinical Impressions(s) / ED Diagnoses   Final diagnoses:  Dysuria  Concern about STD in male without diagnosis   Patient treated in the ED for STI with Rocephin 250 mg IM and Zithromax 1 gram PO. Patient advised to inform and treat all sexual partners.  Pt advised on safe sex practices and understands that they have GC/Chlamydia cultures pending and will result in 2-3 days. HIV and RPR sent. Pt encouraged to follow up at local health department for future STI checks and f/u HIV screening. No concern for prostatitis or  epididymitis. Discussed return precautions. Pt appears safe for discharge.   New Prescriptions Discharge Medication List as of 07/21/2016  6:25 PM       Janne NapoleonHope M Anacaren Kohan, NP 07/21/16 1937    Loren Raceravid Yelverton, MD 07/21/16 2258

## 2016-07-22 LAB — GC/CHLAMYDIA PROBE AMP (~~LOC~~) NOT AT ARMC
CHLAMYDIA, DNA PROBE: NEGATIVE
Neisseria Gonorrhea: POSITIVE — AB

## 2016-07-22 LAB — RPR: RPR Ser Ql: NONREACTIVE

## 2016-07-22 LAB — HIV ANTIBODY (ROUTINE TESTING W REFLEX): HIV Screen 4th Generation wRfx: NONREACTIVE

## 2016-07-23 ENCOUNTER — Telehealth (HOSPITAL_BASED_OUTPATIENT_CLINIC_OR_DEPARTMENT_OTHER): Payer: Self-pay | Admitting: *Deleted

## 2016-07-23 NOTE — Telephone Encounter (Signed)
Positive for gonorrhea treated in the ER with Rocephin and Zithromax 07/21/2016 by Loren Raceravid Yelverton MD. Left a message to patient.

## 2016-07-23 NOTE — Telephone Encounter (Signed)
Patient called back and notified of + STD result and given instruction with understanding.

## 2017-05-19 ENCOUNTER — Emergency Department (HOSPITAL_COMMUNITY): Payer: Medicaid Other

## 2017-05-19 ENCOUNTER — Encounter (HOSPITAL_COMMUNITY): Payer: Self-pay | Admitting: Nurse Practitioner

## 2017-05-19 ENCOUNTER — Emergency Department (HOSPITAL_COMMUNITY)
Admission: EM | Admit: 2017-05-19 | Discharge: 2017-05-19 | Disposition: A | Discharge status: Home / Self Care | Payer: Medicaid Other | Attending: Emergency Medicine | Admitting: Emergency Medicine

## 2017-05-19 DIAGNOSIS — R369 Urethral discharge, unspecified: Secondary | ICD-10-CM

## 2017-05-19 DIAGNOSIS — F17200 Nicotine dependence, unspecified, uncomplicated: Secondary | ICD-10-CM | POA: Insufficient documentation

## 2017-05-19 DIAGNOSIS — R360 Urethral discharge without blood: Secondary | ICD-10-CM | POA: Insufficient documentation

## 2017-05-19 DIAGNOSIS — Z202 Contact with and (suspected) exposure to infections with a predominantly sexual mode of transmission: Secondary | ICD-10-CM

## 2017-05-19 DIAGNOSIS — Z79899 Other long term (current) drug therapy: Secondary | ICD-10-CM | POA: Insufficient documentation

## 2017-05-19 LAB — URINALYSIS, ROUTINE W REFLEX MICROSCOPIC
BILIRUBIN URINE: NEGATIVE
GLUCOSE, UA: NEGATIVE mg/dL
Ketones, ur: NEGATIVE mg/dL
NITRITE: NEGATIVE
PH: 6 (ref 5.0–8.0)
Protein, ur: NEGATIVE mg/dL
SPECIFIC GRAVITY, URINE: 1.014 (ref 1.005–1.030)

## 2017-05-19 MED ORDER — AZITHROMYCIN 250 MG PO TABS
1000.0000 mg | ORAL_TABLET | Freq: Once | ORAL | Status: AC
  Administered 2017-05-19: 1000 mg via ORAL
  Filled 2017-05-19: qty 4

## 2017-05-19 MED ORDER — STERILE WATER FOR INJECTION IJ SOLN
INTRAMUSCULAR | Status: AC
  Administered 2017-05-19: 2 mL
  Filled 2017-05-19: qty 10

## 2017-05-19 MED ORDER — CEFTRIAXONE SODIUM 250 MG IJ SOLR
250.0000 mg | Freq: Once | INTRAMUSCULAR | Status: AC
  Administered 2017-05-19: 250 mg via INTRAMUSCULAR
  Filled 2017-05-19: qty 250

## 2017-05-19 NOTE — ED Notes (Signed)
Pt did not want to wait for further evaluation. Advised that he may be at risk for injury. NP made aware.

## 2017-05-19 NOTE — ED Provider Notes (Signed)
[facility] Provider Note   CSN: [id number] Arrival date & time: 05/19/17  1708  By signing my name below, I, Rosana Fretana Waskiewicz, attest that this documentation has been prepared under the direction and in the presence of non-physician practitioner, Kerrie BuffaloHope Shelley Cocke, NP. Electronically Signed: Rosana Fretana Waskiewicz, ED Scribe. 05/19/17. 7:06 PM.  History   Chief Complaint Chief Complaint  Patient presents with  . SEXUALLY TRANSMITTED DISEASE   The history is provided by the patient. No language interpreter was used.   HPI Comments: Bartholomew BoardsHorace Mccutchan is a 21 y.o. male who presents to the Emergency Department complaining of STD-like symptoms onset 2 days ago. Pt has had Chlamydia before and reports that his symptoms are the similar. Pt has one partner that he has been with for 1 year. Pt reports associated penile discharge, and dysuria. Pt denies fever or chills.   Pt has a second complaint of intermittent, moderate SOB onset 2 days ago. Pt states he feels like he can't breathe correctly on the left side. Pt is an everyday smoker. No treatments tried prior to arrival in the ED. No other complaints at this time  History reviewed. No pertinent past medical history.  There are no active problems to display for this patient.   History reviewed. No pertinent surgical history.     Home Medications    Prior to Admission medications   Medication Sig Start Date End Date Taking? Authorizing Provider  methocarbamol (ROBAXIN) 500 MG tablet Take 1 tablet (500 mg total) by mouth 2 (two) times daily. Patient not taking: Reported on 04/19/2015 01/11/14   Muthersbaugh, Dahlia ClientHannah, PA-C  naproxen (NAPROSYN) 500 MG tablet Take 1 tablet (500 mg total) by mouth 2 (two) times daily with a meal. 01/02/16   Arthor CaptainHarris, Abigail, PA-C    Family History History reviewed. No pertinent family history.  Social History Social History  Substance Use Topics  . Smoking status: Current Every Day Smoker  . Smokeless tobacco:  Never Used  . Alcohol use Yes     Comment: occ     Allergies   Patient has no known allergies.   Review of Systems Review of Systems  Constitutional: Negative for chills and fever.  Respiratory: Positive for shortness of breath.   Genitourinary: Positive for discharge and dysuria.     Physical Exam Updated Vital Signs BP 135/77 (BP Location: Right Arm)   Pulse 63   Temp 98.1 F (36.7 C) (Oral)   Resp 20   SpO2 99%   Physical Exam  Constitutional: He is oriented to person, place, and time. He appears well-developed and well-nourished.  HENT:  Head: Normocephalic and atraumatic.  Eyes: EOM are normal.  Neck: Neck supple.  Cardiovascular: Normal rate and regular rhythm.   Pulmonary/Chest: Effort normal. No respiratory distress. He has no wheezes. He has no rales.  Abdominal: Soft. Bowel sounds are normal. There is no tenderness.  No CVA tenderness.   Genitourinary: Right testis shows no mass, no swelling and no tenderness. Left testis shows no mass, no swelling and no tenderness. Circumcised. No penile tenderness. Discharge found.  Genitourinary Comments: Sample collected with chaperone present. No inguinal nodes. Circumcised. Small amount of white discharge from the urethra. No testicular pain. No swelling.   Lymphadenopathy: No inguinal adenopathy noted on the right or left side.  Neurological: He is alert and oriented to person, place, and time.  Skin: Skin is warm and dry.  Psychiatric: He has a normal mood and affect. His behavior is normal.  Nursing note  and vitals reviewed.    ED Treatments / Results  DIAGNOSTIC STUDIES: Oxygen Saturation is 99% on RA, normal by my interpretation.   COORDINATION OF CARE: 7:02 PM-Discussed next steps with pt including UA and treatment for Gonorrhea and Chlamydia. Pt verbalized understanding and is agreeable with the plan.   Labs (all labs ordered are listed, but only abnormal results are displayed) Labs Reviewed    URINALYSIS, ROUTINE W REFLEX MICROSCOPIC - Abnormal; Notable for the following:       Result Value   Hgb urine dipstick SMALL (*)    Leukocytes, UA SMALL (*)    Bacteria, UA RARE (*)    Squamous Epithelial / LPF 0-5 (*)    All other components within normal limits  HIV ANTIBODY (ROUTINE TESTING)  RPR  GC/CHLAMYDIA PROBE AMP ([place]) NOT AT [facility]     Radiology Dg Chest 2 View  Result Date: 05/19/2017 CLINICAL DATA:  21 year old with intermittent left-sided chest pain and difficulty breathing over the past year. Current smoker. EXAM: CHEST  2 VIEW COMPARISON:  None. FINDINGS: Cardiomediastinal silhouette unremarkable. Lungs clear. Bronchovascular markings normal. Pulmonary vascularity normal. No pneumothorax. No pleural effusions. Visualized bony thorax intact. IMPRESSION: Normal examination. Electronically Signed   By: Hulan Saas M.D.   On: 05/19/2017 20:03    Procedures Procedures (including critical care time)  Medications Ordered in ED Medications  cefTRIAXone (ROCEPHIN) injection 250 mg (250 mg Intramuscular Given 05/19/17 1929)  azithromycin (ZITHROMAX) tablet 1,000 mg (1,000 mg Oral Given 05/19/17 1928)  sterile water (preservative free) injection (2 mLs  Given 05/19/17 1936)     Initial Impression / Assessment and Plan / ED Course  I have reviewed the triage vital signs and the nursing notes.  Pertinent labs & imaging results that were available during my care of the patient were reviewed by me and considered in my medical decision making (see chart for details).  Patient treated in the ED for STI with Rocephin 250 mg IM and Zithromax 1 gram PO. Patient advised to inform and treat all sexual partners.  Pt advised on safe sex practices and understands that they have GC/Chlamydia cultures pending and will result in 2-3 days. HIV and RPR sent. Pt encouraged to follow up at local health department for future STI checks. No concern for prostatitis or epididymitis. Discussed  return precautions. Pt appears safe for discharge.   Patient did not stay for results of CXR stating it is necessary for him to leave.   Final Clinical Impressions(s) / ED Diagnoses   Final diagnoses:  Penile discharge  Possible exposure to STD    New Prescriptions New Prescriptions   No medications on file  I personally performed the services described in this documentation, which was scribed in my presence. The recorded information has been reviewed and is accurate.    Kerrie Buffalo [place], [place] 05/19/17 2050    Alvira Monday, MD 05/21/17 [phone]

## 2017-05-19 NOTE — ED Notes (Signed)
Provider at the bedside.  

## 2017-05-19 NOTE — ED Triage Notes (Signed)
Pt presents with c/o STD symptoms. He reports a 2 day history of dysuria, penile discharge, left side pain. He denies any fevers, chills, nausea, vomiting, constipation, diarrhea.

## 2017-05-20 LAB — GC/CHLAMYDIA PROBE AMP (~~LOC~~) NOT AT ARMC
CHLAMYDIA, DNA PROBE: NEGATIVE
NEISSERIA GONORRHEA: POSITIVE — AB

## 2017-05-20 LAB — HIV ANTIBODY (ROUTINE TESTING W REFLEX): HIV SCREEN 4TH GENERATION: NONREACTIVE

## 2017-05-20 LAB — RPR: RPR Ser Ql: NONREACTIVE

## 2018-12-13 ENCOUNTER — Encounter (HOSPITAL_COMMUNITY): Payer: Self-pay | Admitting: Emergency Medicine

## 2018-12-13 ENCOUNTER — Other Ambulatory Visit: Payer: Self-pay

## 2018-12-13 ENCOUNTER — Emergency Department (HOSPITAL_COMMUNITY)
Admission: EM | Admit: 2018-12-13 | Discharge: 2018-12-13 | Disposition: A | Discharge status: Home / Self Care | Payer: PRIVATE HEALTH INSURANCE | Attending: Emergency Medicine | Admitting: Emergency Medicine

## 2018-12-13 ENCOUNTER — Emergency Department (HOSPITAL_COMMUNITY): Payer: PRIVATE HEALTH INSURANCE

## 2018-12-13 DIAGNOSIS — M79601 Pain in right arm: Secondary | ICD-10-CM

## 2018-12-13 DIAGNOSIS — F17200 Nicotine dependence, unspecified, uncomplicated: Secondary | ICD-10-CM | POA: Diagnosis not present

## 2018-12-13 NOTE — ED Triage Notes (Signed)
Pt. Stated, I got my rt. Arm stuck between 2 pallets and my rt. Arm is hurting and feels numb some. It happened today.

## 2018-12-13 NOTE — ED Provider Notes (Signed)
[facility] Provider Note   CSN: [id number] Arrival date & time: 12/13/18  [id number]     History   Chief Complaint Chief Complaint  Patient presents with  . Arm Pain    HPI Ross Butler is a 22 y.o. male.  HPI   Pt is a 22 y/o male with no significant PMHx who presents  To the ED today c/o RUE pain that began PTA. Pt states he was at work when he arm became stuck between two pallets causing sudden onset of pain to the RUE. His arm was stuck for about 4-5 seconds. Reports pain 4/10 at rest and 7/10 when moving. Has a tingling sensation to his fingers. No other injuries.  History reviewed. No pertinent past medical history.  There are no active problems to display for this patient.   History reviewed. No pertinent surgical history.      Home Medications    Prior to Admission medications   Medication Sig Start Date End Date Taking? Authorizing Provider  methocarbamol (ROBAXIN) 500 MG tablet Take 1 tablet (500 mg total) by mouth 2 (two) times daily. Patient not taking: Reported on 04/19/2015 01/11/14   Muthersbaugh, Dahlia ClientHannah, PA-C  naproxen (NAPROSYN) 500 MG tablet Take 1 tablet (500 mg total) by mouth 2 (two) times daily with a meal. 01/02/16   Arthor CaptainHarris, Abigail, PA-C    Family History No family history on file.  Social History Social History   Tobacco Use  . Smoking status: Current Every Day Smoker  . Smokeless tobacco: Never Used  Substance Use Topics  . Alcohol use: Yes    Comment: occ  . Drug use: Yes    Types: Marijuana     Allergies   Patient has no known allergies.   Review of Systems Review of Systems  Constitutional: Negative for fever.  Musculoskeletal:       Rue pain  Neurological: Negative for weakness.       Paresthesias     Physical Exam Updated Vital Signs BP 140/69 (BP Location: Left Arm)   Pulse 83   Temp 98.4 F (36.9 C) (Oral)   Resp 17   Ht 5\' 9"  (1.753 m)   Wt 65.8 kg   SpO2 100%    BMI 21.41 kg/m   Physical Exam Constitutional:      General: He is not in acute distress.    Appearance: He is well-developed. He is not ill-appearing.  Eyes:     Conjunctiva/sclera: Conjunctivae normal.  Cardiovascular:     Rate and Rhythm: Normal rate and regular rhythm.  Pulmonary:     Effort: Pulmonary effort is normal.     Breath sounds: Normal breath sounds.  Musculoskeletal:     Comments: TTP along to distal biceps/biceps tendon. Also with bony TTP to the medial epicondyle and TTP to the tendons attaching to medial epicondyle. No swelling or ecchymosis. Distal pulses intact. Reports abnormal sensation to all 5 fingers on the right. Has normal cap refill to all fingers. Grossly intact strength to BUE with exception of decreased strength 2/2 pain with elbow flexion.  Skin:    General: Skin is warm and dry.  Neurological:     Mental Status: He is alert and oriented to person, place, and time.     ED Treatments / Results  Labs (all labs ordered are listed, but only abnormal results are displayed) Labs Reviewed - No data to display  EKG None  Radiology Dg Elbow Complete Right  Result Date:  12/13/2018 CLINICAL DATA:  Work injury. EXAM: RIGHT ELBOW - COMPLETE 3+ VIEW COMPARISON:  No recent prior. FINDINGS: No acute bony or joint abnormality identified. No evidence of fracture or dislocation. IMPRESSION: No acute bony abnormality. Electronically Signed   By: Maisie Fushomas  Register   On: 12/13/2018 10:31   Dg Humerus Right  Result Date: 12/13/2018 CLINICAL DATA:  Acute pain along the distal right humerus and elbow after injury at work. EXAM: RIGHT HUMERUS - 2+ VIEW COMPARISON:  None. FINDINGS: There is no evidence of fracture or other focal bone lesions. Soft tissues are unremarkable. IMPRESSION: Negative. Electronically Signed   By: Obie DredgeWilliam T Derry M.D.   On: 12/13/2018 10:32    Procedures Procedures (including critical care time)  Medications Ordered in ED Medications - No  data to display   Initial Impression / Assessment and Plan / ED Course  I have reviewed the triage vital signs and the nursing notes.  Pertinent labs & imaging results that were available during my care of the patient were reviewed by me and considered in my medical decision making (see chart for details).     Final Clinical Impressions(s) / ED Diagnoses   Final diagnoses:  Right arm pain   Patient presenting with complaints of right arm pain after his arm got stuck between 2 pallets at work.  Reports tingling sensation to the tips of the fingers on the right hand.  Strength is grossly intact.  He has no ecchymosis or swelling to the right arm.  No deformity.  X-ray of the right humerus and right elbow are negative for any bony abnormality.  Discussed Tylenol, Motrin and rice protocol.  Gave referral for orthopedics for patient to follow-up with he continues to have symptoms.  Advised him to return to the ER for new or worsening symptoms in the meantime.  He voices understanding the plan and reasons to return to the ED.  All questions answered.  ED Discharge Orders    None       Rayne DuCouture, Avilyn Virtue S, PA-C 12/13/18 1134    LongArlyss Repress, Joshua G, MD 12/13/18 [phone]

## 2018-12-13 NOTE — Discharge Instructions (Addendum)

## 2019-02-13 ENCOUNTER — Emergency Department (HOSPITAL_COMMUNITY)
Admission: EM | Admit: 2019-02-13 | Discharge: 2019-02-13 | Disposition: A | Discharge status: Home / Self Care | Payer: PRIVATE HEALTH INSURANCE | Attending: Emergency Medicine | Admitting: Emergency Medicine

## 2019-02-13 ENCOUNTER — Encounter (HOSPITAL_COMMUNITY): Payer: Self-pay | Admitting: *Deleted

## 2019-02-13 DIAGNOSIS — F17200 Nicotine dependence, unspecified, uncomplicated: Secondary | ICD-10-CM | POA: Diagnosis not present

## 2019-02-13 DIAGNOSIS — R519 Headache, unspecified: Secondary | ICD-10-CM

## 2019-02-13 DIAGNOSIS — R51 Headache: Secondary | ICD-10-CM | POA: Insufficient documentation

## 2019-02-13 MED ORDER — PSEUDOEPHEDRINE HCL ER 120 MG PO TB12: 120.0000 mg | ORAL_TABLET | Freq: Two times a day (BID) | ORAL | 0 refills | Status: DC

## 2019-02-13 MED ORDER — AZITHROMYCIN 250 MG PO TABS
500.0000 mg | ORAL_TABLET | Freq: Once | ORAL | Status: AC
  Administered 2019-02-13: 500 mg via ORAL
  Filled 2019-02-13: qty 2

## 2019-02-13 MED ORDER — AZITHROMYCIN 250 MG PO TABS: 250.0000 mg | ORAL_TABLET | Freq: Every day | ORAL | 0 refills | Status: AC

## 2019-02-13 MED ORDER — PSEUDOEPHEDRINE HCL 30 MG PO TABS
30.0000 mg | ORAL_TABLET | Freq: Once | ORAL | Status: AC
  Administered 2019-02-13: 30 mg via ORAL
  Filled 2019-02-13: qty 1

## 2019-02-13 NOTE — ED Notes (Signed)
Patient discharged from progressive bed., Discharge information reviewed. Patient acknowledged receipt and recommendation for smoking cessation. Scanner not available.

## 2019-02-13 NOTE — ED Triage Notes (Signed)
Pt in c/o left sided facial pain that started after he got a flu shot, thinks he has a sinus infection, pain to his left ear as well, no distress noted

## 2019-02-13 NOTE — ED Provider Notes (Signed)
[facility] EMERGENCY DEPARTMENT Provider Note   CSN: [id number] Arrival date & time: 02/13/19  1021    History   Chief Complaint Chief Complaint  Patient presents with  . Facial Pain    HPI Ross Butler is a 23 y.o. male.     HPI Patient presents with concern of facial pain, swelling. Onset was within the past few days, and since that time symptoms become worse with diffuse pain and swelling throughout the left side of his face.  On there is associated rhinorrhea, fullness in his ear, difficulty swallowing. It is unclear if he has had fever. He has had minimal relief with multiple OTC medication use. Patient smokes cigarettes, states that he is otherwise well. Patient did receive his influenza vaccine prior to the onset of illness. History reviewed. No pertinent past medical history.  There are no active problems to display for this patient.   History reviewed. No pertinent surgical history.      Home Medications    Prior to Admission medications   Medication Sig Start Date End Date Taking? Authorizing Provider  azithromycin (ZITHROMAX) 250 MG tablet Take 1 tablet (250 mg total) by mouth daily for 4 days. Take 1 every day until finished. 02/13/19 02/17/19  Gerhard Munch, MD  methocarbamol (ROBAXIN) 500 MG tablet Take 1 tablet (500 mg total) by mouth 2 (two) times daily. Patient not taking: Reported on 04/19/2015 01/11/14   Muthersbaugh, Dahlia Client, PA-C  naproxen (NAPROSYN) 500 MG tablet Take 1 tablet (500 mg total) by mouth 2 (two) times daily with a meal. 01/02/16   Arthor Captain, PA-C  pseudoephedrine (SUDAFED 12 HOUR) 120 MG 12 hr tablet Take 1 tablet (120 mg total) by mouth 2 (two) times daily. Use twice daily for five days 02/13/19   Gerhard Munch, MD    Family History History reviewed. No pertinent family history.  Social History Social History   Tobacco Use  . Smoking status: Current Every Day Smoker  . Smokeless tobacco: Never Used    Substance Use Topics  . Alcohol use: Yes    Comment: occ  . Drug use: Yes    Types: Marijuana     Allergies   Patient has no known allergies.   Review of Systems Review of Systems  Constitutional:       Per HPI, otherwise negative  HENT:       Per HPI, otherwise negative  Respiratory:       Per HPI, otherwise negative  Cardiovascular:       Per HPI, otherwise negative  Gastrointestinal: Negative for vomiting.  Endocrine:       Negative aside from HPI  Genitourinary:       Neg aside from HPI   Musculoskeletal:       Per HPI, otherwise negative  Skin: Negative.   Neurological: Negative for syncope.     Physical Exam Updated Vital Signs BP (!) 152/83 (BP Location: Right Arm)   Pulse (!) 59   Temp 98.5 F (36.9 C) (Oral)   Resp 16   SpO2 100%   Physical Exam Vitals signs and nursing note reviewed.  Constitutional:      General: He is not in acute distress.    Appearance: He is well-developed.  HENT:     Head: Normocephalic and atraumatic.     Comments: Tenderness to palpation throughout the maxillary, frontal sinuses and along the left maxilla, mandibular ridge.    Right Ear: Tympanic membrane normal.     Left  Ear: Tympanic membrane normal.     Ears:     Comments: Trace fluid otherwise tympanic membrane's within normal limits on both sides. Eyes:     Conjunctiva/sclera: Conjunctivae normal.  Cardiovascular:     Rate and Rhythm: Normal rate and regular rhythm.  Pulmonary:     Effort: Pulmonary effort is normal. No respiratory distress.     Breath sounds: No stridor.  Abdominal:     General: There is no distension.  Skin:    General: Skin is warm and dry.  Neurological:     Mental Status: He is alert and oriented to person, place, and time.      ED Treatments / Results   Procedures Procedures (including critical care time)  Medications Ordered in ED Medications  azithromycin (ZITHROMAX) tablet 500 mg (has no administration in time range)   pseudoephedrine (SUDAFED) tablet 30 mg (has no administration in time range)     Initial Impression / Assessment and Plan / ED Course  I have reviewed the triage vital signs and the nursing notes.  Pertinent labs & imaging results that were available during my care of the patient were reviewed by me and considered in my medical decision making (see chart for details).  Previously well young male presents with several days of left-sided facial pain, swelling, signs and symptoms consistent with sinusitis. Here patient was awake and alert, no evidence for oral pharyngeal asymmetry, abscess, bacteremia, sepsis. He was started on antibiotics, decongestants, and after conversation on smoking cessation as well, the patient was discharged in stable condition.  Final Clinical Impressions(s) / ED Diagnoses   Final diagnoses:  Facial pain    ED Discharge Orders         Ordered    azithromycin (ZITHROMAX) 250 MG tablet  Daily     02/13/19 1120    pseudoephedrine (SUDAFED 12 HOUR) 120 MG 12 hr tablet  2 times daily     02/13/19 1120           Gerhard Munch, MD 02/13/19 1123

## 2019-02-13 NOTE — Discharge Instructions (Signed)
As discussed, today's evaluation has been generally reassuring. Please take your medication as prescribed, and follow-up with our primary care clinic as needed. In addition to take medication, smoking cessation is important to overall health and recovering from this illness. Please use the provided resources for additional assistance with stopping cigarette use.  Turn here for concerning changes in your condition.

## 2019-02-18 ENCOUNTER — Encounter (HOSPITAL_COMMUNITY): Payer: Self-pay | Admitting: Emergency Medicine

## 2019-02-18 ENCOUNTER — Emergency Department (HOSPITAL_COMMUNITY): Payer: PRIVATE HEALTH INSURANCE

## 2019-02-18 ENCOUNTER — Observation Stay (HOSPITAL_COMMUNITY): Payer: PRIVATE HEALTH INSURANCE | Admitting: Certified Registered Nurse Anesthetist

## 2019-02-18 ENCOUNTER — Other Ambulatory Visit: Payer: Self-pay

## 2019-02-18 ENCOUNTER — Observation Stay (HOSPITAL_COMMUNITY)
Admission: EM | Admit: 2019-02-18 | Discharge: 2019-02-18 | Disposition: A | Discharge status: Home / Self Care | Payer: PRIVATE HEALTH INSURANCE | Attending: Student in an Organized Health Care Education/Training Program | Admitting: Student in an Organized Health Care Education/Training Program

## 2019-02-18 ENCOUNTER — Other Ambulatory Visit: Payer: Self-pay | Admitting: Oral Surgery

## 2019-02-18 ENCOUNTER — Encounter (HOSPITAL_COMMUNITY)
Admission: EM | Disposition: A | Discharge status: Home / Self Care | Payer: Self-pay | Source: Home / Self Care | Attending: Emergency Medicine

## 2019-02-18 ENCOUNTER — Observation Stay (HOSPITAL_COMMUNITY): Payer: PRIVATE HEALTH INSURANCE

## 2019-02-18 DIAGNOSIS — K047 Periapical abscess without sinus: Secondary | ICD-10-CM | POA: Diagnosis not present

## 2019-02-18 DIAGNOSIS — F17210 Nicotine dependence, cigarettes, uncomplicated: Secondary | ICD-10-CM | POA: Insufficient documentation

## 2019-02-18 DIAGNOSIS — K122 Cellulitis and abscess of mouth: Secondary | ICD-10-CM | POA: Diagnosis not present

## 2019-02-18 HISTORY — PX: TOOTH EXTRACTION: SHX859

## 2019-02-18 HISTORY — DX: Other specified health status: Z78.9

## 2019-02-18 LAB — CBC WITH DIFFERENTIAL/PLATELET
Abs Immature Granulocytes: 0.05 10*3/uL (ref 0.00–0.07)
Basophils Absolute: 0.1 10*3/uL (ref 0.0–0.1)
Basophils Relative: 0 %
Eosinophils Absolute: 0 10*3/uL (ref 0.0–0.5)
Eosinophils Relative: 0 %
HCT: 47.7 % (ref 39.0–52.0)
Hemoglobin: 16.3 g/dL (ref 13.0–17.0)
Immature Granulocytes: 0 %
Lymphocytes Relative: 14 %
Lymphs Abs: 1.9 10*3/uL (ref 0.7–4.0)
MCH: 30 pg (ref 26.0–34.0)
MCHC: 34.2 g/dL (ref 30.0–36.0)
MCV: 87.8 fL (ref 80.0–100.0)
Monocytes Absolute: 1.3 10*3/uL — ABNORMAL HIGH (ref 0.1–1.0)
Monocytes Relative: 10 %
Neutro Abs: 10.6 10*3/uL — ABNORMAL HIGH (ref 1.7–7.7)
Neutrophils Relative %: 76 %
Platelets: 218 10*3/uL (ref 150–400)
RBC: 5.43 MIL/uL (ref 4.22–5.81)
RDW: 11.4 % — ABNORMAL LOW (ref 11.5–15.5)
WBC: 14 10*3/uL — ABNORMAL HIGH (ref 4.0–10.5)
nRBC: 0 % (ref 0.0–0.2)

## 2019-02-18 LAB — BASIC METABOLIC PANEL
Anion gap: 11 (ref 5–15)
BUN: 7 mg/dL (ref 6–20)
CO2: 25 mmol/L (ref 22–32)
Calcium: 10.4 mg/dL — ABNORMAL HIGH (ref 8.9–10.3)
Chloride: 101 mmol/L (ref 98–111)
Creatinine, Ser: 0.94 mg/dL (ref 0.61–1.24)
GFR calc Af Amer: >60 mL/min (ref >60)
GFR calc non Af Amer: >60 mL/min (ref >60)
Glucose, Bld: 91 mg/dL (ref 70–99)
Potassium: 4.2 mmol/L (ref 3.5–5.1)
Sodium: 137 mmol/L (ref 135–145)

## 2019-02-18 SURGERY — DENTAL RESTORATION/EXTRACTIONS
Anesthesia: General | Site: Mouth

## 2019-02-18 MED ORDER — ACETAMINOPHEN 325 MG PO TABS: 650.0000 mg | ORAL_TABLET | Freq: Four times a day (QID) | ORAL | Status: DC | PRN

## 2019-02-18 MED ORDER — OXYCODONE-ACETAMINOPHEN 5-325 MG PO TABS
1.0000 | ORAL_TABLET | ORAL | Status: DC | PRN
  Administered 2019-02-18: 1 via ORAL
  Filled 2019-02-18: qty 1

## 2019-02-18 MED ORDER — ONDANSETRON HCL 4 MG/2ML IJ SOLN
INTRAMUSCULAR | Status: DC | PRN
  Administered 2019-02-18: 4 mg via INTRAVENOUS

## 2019-02-18 MED ORDER — OXYMETAZOLINE HCL 0.05 % NA SOLN
NASAL | Status: AC
  Filled 2019-02-18: qty 30

## 2019-02-18 MED ORDER — PROPOFOL 10 MG/ML IV BOLUS
INTRAVENOUS | Status: DC | PRN
  Administered 2019-02-18: 200 mg via INTRAVENOUS

## 2019-02-18 MED ORDER — FENTANYL CITRATE (PF) 250 MCG/5ML IJ SOLN
INTRAMUSCULAR | Status: DC | PRN
  Administered 2019-02-18: 100 ug via INTRAVENOUS
  Administered 2019-02-18 (×3): 50 ug via INTRAVENOUS

## 2019-02-18 MED ORDER — KETOROLAC TROMETHAMINE 30 MG/ML IJ SOLN: 30.0000 mg | Freq: Three times a day (TID) | INTRAMUSCULAR | Status: DC | PRN

## 2019-02-18 MED ORDER — BACITRACIN ZINC 500 UNIT/GM EX OINT
TOPICAL_OINTMENT | CUTANEOUS | Status: AC
  Filled 2019-02-18: qty 28.35

## 2019-02-18 MED ORDER — IOHEXOL 300 MG/ML  SOLN
75.0000 mL | Freq: Once | INTRAMUSCULAR | Status: AC | PRN
  Administered 2019-02-18: 75 mL via INTRAVENOUS

## 2019-02-18 MED ORDER — OXYCODONE-ACETAMINOPHEN 5-325 MG PO TABS: 1.0000 | ORAL_TABLET | Freq: Three times a day (TID) | ORAL | 0 refills | Status: DC | PRN

## 2019-02-18 MED ORDER — LACTATED RINGERS IV SOLN: INTRAVENOUS | Status: DC

## 2019-02-18 MED ORDER — FENTANYL CITRATE (PF) 250 MCG/5ML IJ SOLN
INTRAMUSCULAR | Status: AC
  Filled 2019-02-18: qty 5

## 2019-02-18 MED ORDER — MIDAZOLAM HCL 2 MG/2ML IJ SOLN
INTRAMUSCULAR | Status: AC
  Filled 2019-02-18: qty 2

## 2019-02-18 MED ORDER — MIDAZOLAM HCL 2 MG/2ML IJ SOLN
INTRAMUSCULAR | Status: DC | PRN
  Administered 2019-02-18: 2 mg via INTRAVENOUS

## 2019-02-18 MED ORDER — PHENYLEPHRINE 40 MCG/ML (10ML) SYRINGE FOR IV PUSH (FOR BLOOD PRESSURE SUPPORT)
PREFILLED_SYRINGE | INTRAVENOUS | Status: DC | PRN
  Administered 2019-02-18: 80 ug via INTRAVENOUS

## 2019-02-18 MED ORDER — BACITRACIN ZINC 500 UNIT/GM EX OINT
TOPICAL_OINTMENT | CUTANEOUS | Status: DC | PRN
  Administered 2019-02-18: 1 via TOPICAL

## 2019-02-18 MED ORDER — PROPOFOL 10 MG/ML IV BOLUS
INTRAVENOUS | Status: AC
  Filled 2019-02-18: qty 20

## 2019-02-18 MED ORDER — NABUMETONE 500 MG PO TABS
500.0000 mg | ORAL_TABLET | Freq: Two times a day (BID) | ORAL | Status: DC | PRN
  Filled 2019-02-18: qty 1

## 2019-02-18 MED ORDER — ACETAMINOPHEN 650 MG RE SUPP: 650.0000 mg | Freq: Four times a day (QID) | RECTAL | Status: DC | PRN

## 2019-02-18 MED ORDER — SUCCINYLCHOLINE CHLORIDE 200 MG/10ML IV SOSY
PREFILLED_SYRINGE | INTRAVENOUS | Status: DC | PRN
  Administered 2019-02-18: 80 mg via INTRAVENOUS

## 2019-02-18 MED ORDER — DEXAMETHASONE SODIUM PHOSPHATE 10 MG/ML IJ SOLN
INTRAMUSCULAR | Status: DC | PRN
  Administered 2019-02-18: 10 mg via INTRAVENOUS

## 2019-02-18 MED ORDER — ESMOLOL HCL 100 MG/10ML IV SOLN
INTRAVENOUS | Status: DC | PRN
  Administered 2019-02-18 (×3): 20 mg via INTRAVENOUS

## 2019-02-18 MED ORDER — CLINDAMYCIN PHOSPHATE 600 MG/50ML IV SOLN
600.0000 mg | Freq: Three times a day (TID) | INTRAVENOUS | Status: DC
  Administered 2019-02-18: 600 mg via INTRAVENOUS
  Filled 2019-02-18 (×2): qty 50

## 2019-02-18 MED ORDER — LIDOCAINE-EPINEPHRINE 2 %-1:100000 IJ SOLN
INTRAMUSCULAR | Status: AC
  Filled 2019-02-18: qty 1

## 2019-02-18 MED ORDER — CLINDAMYCIN PHOSPHATE 600 MG/50ML IV SOLN
600.0000 mg | Freq: Once | INTRAVENOUS | Status: AC
  Administered 2019-02-18: 600 mg via INTRAVENOUS
  Filled 2019-02-18: qty 50

## 2019-02-18 MED ORDER — MORPHINE SULFATE (PF) 4 MG/ML IV SOLN
4.0000 mg | Freq: Once | INTRAVENOUS | Status: AC
  Administered 2019-02-18: 4 mg via INTRAVENOUS
  Filled 2019-02-18: qty 1

## 2019-02-18 MED ORDER — 0.9 % SODIUM CHLORIDE (POUR BTL) OPTIME
TOPICAL | Status: DC | PRN
  Administered 2019-02-18: 1000 mL

## 2019-02-18 MED ORDER — MORPHINE SULFATE (PF) 4 MG/ML IV SOLN: 4.0000 mg | Freq: Once | INTRAVENOUS | Status: DC

## 2019-02-18 MED ORDER — LIDOCAINE 2% (20 MG/ML) 5 ML SYRINGE
INTRAMUSCULAR | Status: DC | PRN
  Administered 2019-02-18: 60 mg via INTRAVENOUS

## 2019-02-18 MED ORDER — HYDROMORPHONE HCL 1 MG/ML IJ SOLN
0.5000 mg | Freq: Once | INTRAMUSCULAR | Status: AC
  Administered 2019-02-18: 0.5 mg via INTRAVENOUS
  Filled 2019-02-18: qty 1

## 2019-02-18 MED ORDER — ENOXAPARIN SODIUM 40 MG/0.4ML ~~LOC~~ SOLN: 40.0000 mg | SUBCUTANEOUS | Status: DC

## 2019-02-18 MED ORDER — LACTATED RINGERS IV SOLN
INTRAVENOUS | Status: DC | PRN
  Administered 2019-02-18: 16:00:00 via INTRAVENOUS

## 2019-02-18 MED ORDER — LIDOCAINE-EPINEPHRINE 2 %-1:100000 IJ SOLN
INTRAMUSCULAR | Status: DC | PRN
  Administered 2019-02-18: 10 mL

## 2019-02-18 MED ORDER — HYDROMORPHONE HCL 1 MG/ML IJ SOLN
0.5000 mg | INTRAMUSCULAR | Status: DC | PRN
  Administered 2019-02-18: 0.5 mg via INTRAVENOUS
  Filled 2019-02-18: qty 1

## 2019-02-18 MED ORDER — DEXMEDETOMIDINE HCL 200 MCG/2ML IV SOLN
INTRAVENOUS | Status: DC | PRN
  Administered 2019-02-18 (×3): 8 ug via INTRAVENOUS

## 2019-02-18 MED ORDER — CLINDAMYCIN HCL 150 MG PO CAPS: 450.0000 mg | ORAL_CAPSULE | Freq: Three times a day (TID) | ORAL | 0 refills | Status: DC

## 2019-02-18 SURGICAL SUPPLY — 43 items
BLADE SURG 15 STRL LF DISP TIS (BLADE) ×1 IMPLANT
BLADE SURG 15 STRL SS (BLADE) ×2
BUR CROSS CUT FISSURE 1.6 (BURR) IMPLANT
BUR CROSS CUT FISSURE 1.6MM (BURR)
BUR EGG ELITE 4.0 (BURR) IMPLANT
BUR EGG ELITE 4.0MM (BURR)
CANISTER SUCT 3000ML PPV (MISCELLANEOUS) ×3 IMPLANT
COVER SURGICAL LIGHT HANDLE (MISCELLANEOUS) ×3 IMPLANT
COVER WAND RF STERILE (DRAPES) ×3 IMPLANT
DECANTER SPIKE VIAL GLASS SM (MISCELLANEOUS) ×3 IMPLANT
DRAPE U-SHAPE 76X120 STRL (DRAPES) IMPLANT
GAUZE PACKING FOLDED 2  STR (GAUZE/BANDAGES/DRESSINGS) ×2
GAUZE PACKING FOLDED 2 STR (GAUZE/BANDAGES/DRESSINGS) ×1 IMPLANT
GAUZE SPONGE 4X4 12PLY STRL LF (GAUZE/BANDAGES/DRESSINGS) ×3 IMPLANT
GLOVE BIO SURGEON STRL SZ 6.5 (GLOVE) IMPLANT
GLOVE BIO SURGEON STRL SZ7 (GLOVE) IMPLANT
GLOVE BIO SURGEON STRL SZ7.5 (GLOVE) ×3 IMPLANT
GLOVE BIO SURGEONS STRL SZ 6.5 (GLOVE)
GLOVE BIOGEL PI IND STRL 6.5 (GLOVE) IMPLANT
GLOVE BIOGEL PI IND STRL 7.0 (GLOVE) IMPLANT
GLOVE BIOGEL PI INDICATOR 6.5 (GLOVE)
GLOVE BIOGEL PI INDICATOR 7.0 (GLOVE)
GOWN STRL REUS W/ TWL LRG LVL3 (GOWN DISPOSABLE) ×2 IMPLANT
GOWN STRL REUS W/ TWL XL LVL3 (GOWN DISPOSABLE) ×1 IMPLANT
GOWN STRL REUS W/TWL LRG LVL3 (GOWN DISPOSABLE) ×4
GOWN STRL REUS W/TWL XL LVL3 (GOWN DISPOSABLE) ×2
IV NS 1000ML (IV SOLUTION) ×2
IV NS 1000ML BAXH (IV SOLUTION) ×1 IMPLANT
KIT BASIN OR (CUSTOM PROCEDURE TRAY) ×3 IMPLANT
KIT TURNOVER KIT B (KITS) ×3 IMPLANT
NEEDLE 22X1 1/2 (OR ONLY) (NEEDLE) ×3 IMPLANT
NS IRRIG 1000ML POUR BTL (IV SOLUTION) ×3 IMPLANT
PAD ARMBOARD 7.5X6 YLW CONV (MISCELLANEOUS) ×3 IMPLANT
SLEEVE IRRIGATION ELITE 7 (MISCELLANEOUS) IMPLANT
SPONGE SURGIFOAM ABS GEL 12-7 (HEMOSTASIS) IMPLANT
SUT CHROMIC 3 0 PS 2 (SUTURE) IMPLANT
SUT ETHILON 3 0 PS 1 (SUTURE) ×3 IMPLANT
SUT SILK 3 0 SH 30 (SUTURE) ×6 IMPLANT
SYR CONTROL 10ML LL (SYRINGE) IMPLANT
TAPE CLOTH SURG 4X10 WHT LF (GAUZE/BANDAGES/DRESSINGS) ×3 IMPLANT
TRAY ENT MC OR (CUSTOM PROCEDURE TRAY) ×3 IMPLANT
TUBING IRRIGATION (MISCELLANEOUS) IMPLANT
YANKAUER SUCT BULB TIP NO VENT (SUCTIONS) ×3 IMPLANT

## 2019-02-18 NOTE — ED Provider Notes (Signed)
[facility] EMERGENCY DEPARTMENT Provider Note   CSN: [id number] Arrival date & time: 02/18/19  0092    History   Chief Complaint Chief Complaint  Patient presents with  . Facial Pain    HPI Ross Butler is a 23 y.o. male.     HPI   23 year old male presents today with complaints of facial swelling.  Patient notes a one-week history of facial pain.  He notes originally symptoms started with pain on the left side of his face and maxillary region.  He notes he was seen in the emergency room and prescribed azithromycin.  Patient notes he had a small amount of maxillary swelling that went away, but has developed swelling to the left lateral lower chin and neck.  Patient notes the pain is very severe in the morning, denies any significant intraoral swelling.  Patient has not been able to eat or drink since yesterday.  Patient notes he feels hot but denies objective fever.  He denies any significant past medical history.  No medications prior to arrival today.  History reviewed. No pertinent past medical history.  Patient Active Problem List   Diagnosis Date Noted  . Dental abscess 02/18/2019    History reviewed. No pertinent surgical history.      Home Medications    Prior to Admission medications   Medication Sig Start Date End Date Taking? Authorizing Provider  methocarbamol (ROBAXIN) 500 MG tablet Take 1 tablet (500 mg total) by mouth 2 (two) times daily. Patient not taking: Reported on 04/19/2015 01/11/14   Muthersbaugh, Dahlia Client, PA-C  naproxen (NAPROSYN) 500 MG tablet Take 1 tablet (500 mg total) by mouth 2 (two) times daily with a meal. 01/02/16   Arthor Captain, PA-C  pseudoephedrine (SUDAFED 12 HOUR) 120 MG 12 hr tablet Take 1 tablet (120 mg total) by mouth 2 (two) times daily. Use twice daily for five days 02/13/19   Gerhard Munch, MD    Family History History reviewed. No pertinent family history.  Social History Social History   Tobacco  Use  . Smoking status: Current Every Day Smoker  . Smokeless tobacco: Never Used  Substance Use Topics  . Alcohol use: Yes    Comment: occ  . Drug use: Yes    Types: Marijuana     Allergies   Patient has no known allergies.   Review of Systems Review of Systems  All other systems reviewed and are negative.   Physical Exam Updated Vital Signs BP (!) 165/105 (BP Location: Right Arm)   Pulse 90   Temp 98.7 F (37.1 C) (Oral)   Resp 20   SpO2 98%   Physical Exam Vitals signs and nursing note reviewed.  Constitutional:      Appearance: He is well-developed.  HENT:     Head: Normocephalic and atraumatic.     Comments: Swelling and abscess noted along the left lateral mandible with induration spreading to the submandibular tissue, oropharynx clear floor the mouth is soft, small amount of induration along the left lateral gumline- no pooling of secretions Eyes:     General: No scleral icterus.       Right eye: No discharge.        Left eye: No discharge.     Conjunctiva/sclera: Conjunctivae normal.     Pupils: Pupils are equal, round, and reactive to light.  Neck:     Musculoskeletal: Normal range of motion.     Vascular: No JVD.     Trachea: No tracheal deviation.  Pulmonary:     Effort: Pulmonary effort is normal.     Breath sounds: No stridor.  Neurological:     Mental Status: He is alert and oriented to person, place, and time.     Coordination: Coordination normal.  Psychiatric:        Behavior: Behavior normal.        Thought Content: Thought content normal.        Judgment: Judgment normal.     ED Treatments / Results  Labs (all labs ordered are listed, but only abnormal results are displayed) Labs Reviewed  CBC WITH DIFFERENTIAL/PLATELET - Abnormal; Notable for the following components:      Result Value   WBC 14.0 (*)    RDW 11.4 (*)    Neutro Abs 10.6 (*)    Monocytes Absolute 1.3 (*)    All other components within normal limits  BASIC METABOLIC  PANEL - Abnormal; Notable for the following components:   Calcium 10.4 (*)    All other components within normal limits    EKG None  Radiology Dg Orthopantogram  Result Date: 02/18/2019 CLINICAL DATA:  Left facial pain.  Known odontogenic infection. EXAM: ORTHOPANTOGRAM/PANORAMIC COMPARISON:  CT of the neck 02/17/2018. FINDINGS: Dental caries involving tooth #19 and periapical lucency are again seen. No additional dental caries are evident. Soft tissue changes are better appreciated on CT. IMPRESSION: 1. Dental caries of tooth 19. 2. Periapical disease of the same tooth. Electronically Signed   By: Marin Roberts M.D.   On: 02/18/2019 14:39   Ct Soft Tissue Neck W Contrast  Result Date: 02/18/2019 CLINICAL DATA:  Left-sided facial pain for over a week EXAM: CT NECK WITH CONTRAST TECHNIQUE: Multidetector CT imaging of the neck was performed using the standard protocol following the bolus administration of intravenous contrast. CONTRAST:  38mL OMNIPAQUE IOHEXOL 300 MG/ML  SOLN COMPARISON:  None. FINDINGS: Pharynx and larynx: Mild adenoid thickening. No submucosal edema or airway narrowing. Salivary glands: No primary inflammation. Thyroid: Negative Lymph nodes: Reactive nodal enlargement in the left more than right neck Vascular: Major venous structures are patent Limited intracranial: Negative Visualized orbits: Negative Mastoids and visualized paranasal sinuses: Essentially clear Skeleton: Periapical erosion around tooth number tooth 19 which has a large cavity and periapical erosion. There is adjacent low-density collection along the buccal surface measuring up to 13 mm diameter at the level of the bone. Best seen on coronal reformats this collection extends into the subcutaneous left submandibular region where there is a 2.5 cm abscess that has nearly decompressed through the skin. No clear floor of mouth swelling or mass effect. There is regional cellulitis. 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 IMPRESSION: 1. Odontogenic soft tissue infection related to tooth 19 with subperiosteal abscess extending into the subcutaneous left submandibular space where there is a 2.5 cm abscess. 2. 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 Electronically Signed   By: Marnee Spring M.D.   On: 02/18/2019 12:29    Procedures Procedures (including critical care time)  Medications Ordered in ED Medications  enoxaparin (LOVENOX) injection 40 mg (has no administration in time range)  acetaminophen (TYLENOL) tablet 650 mg (has no administration in time range)    Or  acetaminophen (TYLENOL) suppository 650 mg (has no administration in time range)  HYDROmorphone (DILAUDID) injection 0.5 mg (0.5 mg Intravenous Given 02/18/19 1427)  ketorolac (TORADOL) 30 MG/ML injection 30 mg (has no administration in time range)  clindamycin (  CLEOCIN) IVPB 600 mg (has no administration in time range)  morphine 4 MG/ML injection 4 mg (4 mg Intravenous Given 02/18/19 1008)  morphine 4 MG/ML injection 4 mg (4 mg Intravenous Given 02/18/19 1137)  iohexol (OMNIPAQUE) 300 MG/ML solution 75 mL (75 mLs Intravenous Contrast Given 02/18/19 1205)  clindamycin (CLEOCIN) IVPB 600 mg (0 mg Intravenous Stopped 02/18/19 1408)  HYDROmorphone (DILAUDID) injection 0.5 mg (0.5 mg Intravenous Given 02/18/19 1334)     Initial Impression / Assessment and Plan / ED Course  I have reviewed the triage vital signs and the nursing notes.  Pertinent labs & imaging results that were available during my care of the patient were reviewed by me and considered in my medical decision making (see chart for details).        Labs:   Imaging:  Consults:  Therapeutics:  Discharge Meds:   Assessment/Plan: 23 year old male presents today with submandibular abscess secondary to odontogenic infection.  Patient is afebrile, does have an elevated white count.  Given the extensive  nature of the infection oral surgery was consulted.  I spoke with Dr. Barbette Merino who recommended hospital admission, he would be coming to the hospital to evaluate the patient for likely OR management.  Patient started on clindamycin.   Internal medicine consulted who agrees to hospital admission    Final Clinical Impressions(s) / ED Diagnoses   Final diagnoses:  Dental infection    ED Discharge Orders    None       Rosalio Loud 02/18/19 1450    Linwood Dibbles, MD 02/19/19 1126

## 2019-02-18 NOTE — Anesthesia Preprocedure Evaluation (Addendum)
Anesthesia Evaluation  Patient identified by MRN, date of birth, ID band Patient awake    Reviewed: Allergy & Precautions, NPO status , Patient's Chart, lab work & pertinent test results  History of Anesthesia Complications Negative for: history of anesthetic complications  Airway Mallampati: IV  TM Distance: >3 FB Neck ROM: Full  Mouth opening: Limited Mouth Opening  Dental  (+) Teeth Intact,    Pulmonary Current Smoker,    breath sounds clear to auscultation       Cardiovascular negative cardio ROS   Rhythm:Regular     Neuro/Psych negative neurological ROS  negative psych ROS   GI/Hepatic negative GI ROS, Neg liver ROS,   Endo/Other  negative endocrine ROS  Renal/GU negative Renal ROS     Musculoskeletal negative musculoskeletal ROS (+)   Abdominal   Peds  Hematology negative hematology ROS (+)   Anesthesia Other Findings   Reproductive/Obstetrics                            Anesthesia Physical Anesthesia Plan  ASA: II  Anesthesia Plan: General   Post-op Pain Management:    Induction: Intravenous  PONV Risk Score and Plan: 1 and Ondansetron and Dexamethasone  Airway Management Planned: Oral ETT  Additional Equipment: None  Intra-op Plan:   Post-operative Plan: Extubation in OR  Informed Consent: I have reviewed the patients History and Physical, chart, labs and discussed the procedure including the risks, benefits and alternatives for the proposed anesthesia with the patient or authorized representative who has indicated his/her understanding and acceptance.     Dental advisory given  Plan Discussed with: CRNA and Surgeon  Anesthesia Plan Comments:        Anesthesia Quick Evaluation

## 2019-02-18 NOTE — Op Note (Signed)
02/18/2019  4:14 PM  PATIENT:  Ross Butler  23 y.o. male  PRE-OPERATIVE DIAGNOSIS:  Left submandibular space infection, abscessed tooth # 19  POST-OPERATIVE DIAGNOSIS:  SAME  PROCEDURE:  Procedure(s):  EXTRACTION TOOTH 19 AND INCISION AND DRAINAGE OF LEFT SUBMANDIBULAR SPACE ABSCESS  SURGEON:  Surgeon(s): Ocie Doyne, DDS  ANESTHESIA:   local and general  EBL:  minimal  DRAINS:1/4" PENROSE LEFT MANDIBLE  SPECIMEN:  No Specimen  COUNTS:  YES  PLAN OF CARE: Discharge to home after PACU  PATIENT DISPOSITION:  PACU - hemodynamically stable.   PROCEDURE DETAILS: Dictation # [id number]  Ross Butler, DMD 02/18/2019 4:14 PM

## 2019-02-18 NOTE — ED Notes (Signed)
Patient transported to x-ray. ?

## 2019-02-18 NOTE — ED Triage Notes (Signed)
Patient reports continued L-sided facial pain for over a week now - seen on 3/2 for same and has finished prescribed antibiotic and steroid. He states the swelling has moved to L side of neck - erythema noted to area. Denies fevers/chills.

## 2019-02-18 NOTE — Anesthesia Postprocedure Evaluation (Signed)
Anesthesia Post Note  Patient: Ross Butler  Procedure(s) Performed: DENTAL EXTRACTION TOOTH 19 AND DRAINAGE OF ABSCESS (N/A Mouth)     Patient location during evaluation: PACU Anesthesia Type: General Level of consciousness: awake and alert Pain management: pain level controlled Vital Signs Assessment: post-procedure vital signs reviewed and stable Respiratory status: spontaneous breathing, nonlabored ventilation, respiratory function stable and patient connected to nasal cannula oxygen Cardiovascular status: stable and blood pressure returned to baseline Postop Assessment: no apparent nausea or vomiting Anesthetic complications: no    Last Vitals:  Vitals:   02/18/19 1720 02/18/19 1735  BP: 138/84 (!) 146/75  Pulse: 81 67  Resp: 14 12  Temp:    SpO2: 100% 100%    Last Pain:  Vitals:   02/18/19 1811  TempSrc:   PainSc: 0-No pain                 Nyhla Mountjoy

## 2019-02-18 NOTE — Anesthesia Procedure Notes (Signed)
Procedure Name: Intubation Date/Time: 02/18/2019 3:52 PM Performed by: Dairl Ponder, CRNA Pre-anesthesia Checklist: Patient identified, Emergency Drugs available, Suction available, Patient being monitored and Timeout performed Patient Re-evaluated:Patient Re-evaluated prior to induction Oxygen Delivery Method: Circle system utilized Preoxygenation: Pre-oxygenation with 100% oxygen Induction Type: IV induction and Rapid sequence Laryngoscope Size: Glidescope and 4 Grade View: Grade I Tube type: Oral Tube size: 7.5 mm Number of attempts: 1 Airway Equipment and Method: Stylet Placement Confirmation: ETT inserted through vocal cords under direct vision,  positive ETCO2 and breath sounds checked- equal and bilateral Secured at: 23 cm Tube secured with: Tape Dental Injury: Teeth and Oropharynx as per pre-operative assessment

## 2019-02-18 NOTE — Discharge Instructions (Signed)
Please keep the surgical wound site clean and clear until your follow-up with the oral surgeon on Monday. Please visit Dr. Christin Fudge office on Monday.  Please take the clindamycin until the script is complete.

## 2019-02-18 NOTE — Progress Notes (Signed)
Discharged  Home/wound care instructions given to patient. Stated understanding. Pain level is 4/10. 1 of percocet given prior to d/c home.

## 2019-02-18 NOTE — OR Nursing (Signed)
02/18/2019  Throat pack in at -1605  Throat pack out at 1607

## 2019-02-18 NOTE — Transfer of Care (Signed)
Immediate Anesthesia Transfer of Care Note  Patient: Ross Butler  Procedure(s) Performed: DENTAL EXTRACTION TOOTH 19 AND DRAINAGE OF ABSCESS (N/A Mouth)  Patient Location: PACU  Anesthesia Type:General  Level of Consciousness: awake, alert  and oriented  Airway & Oxygen Therapy: Patient Spontanous Breathing  Post-op Assessment: Report given to RN and Post -op Vital signs reviewed and stable  Post vital signs: Reviewed and stable  Last Vitals:  Vitals Value Taken Time  BP 125/74 02/18/2019  4:33 PM  Temp    Pulse 95 02/18/2019  4:35 PM  Resp 12 02/18/2019  4:35 PM  SpO2 100 % 02/18/2019  4:35 PM  Vitals shown include unvalidated device data.  Last Pain:  Vitals:   02/18/19 1427  TempSrc:   PainSc: 7          Complications: No apparent anesthesia complications

## 2019-02-18 NOTE — H&P (Signed)
     Date: 02/18/2019               Patient Name:  Ross Butler MRN: [id number]  DOB: Aug 21, 1996 Age / Sex: 23 y.o., male   PCP: Patient, No Pcp Per         Medical Service: Internal Medicine Teaching Service         Attending Physician: Dr. Oswaldo Done, Marquita Palms, *    First Contact: Dr. Chesley Mires Pager: [phone]  Second Contact: Dr. Crista Elliot Pager: [phone]       After Hours (After 5p/  First Contact Pager: [phone]  weekends / holidays): Second Contact Pager: [phone]   Chief Complaint: facial pain  History of Present Illness: Mr. Ledgerwood is a 22 y/o otherwise healthy male who presents for one week history of progressively worsening left sided facial pain. Initially he had pain in his left jaw and maxillary region along with URI symptoms. He was seen in the ED 4 days ago and was given Azithromycin for sinusitis. States that these symptoms improved. However, his left jaw pain and swelling continued to worsen. He has not been able to eat much this week secondary to pain and has mostly been taking in fluids. He has tried Tylenol and heating pad at home with minimal relief. Pain radiates into his neck and up into his frontotemporal region. Endorses subjective fevers and cold sweats. Denies lightheadedness, sore throat, dysphagia, excessive secretions, shortness of breath, chest pain, palpitations, abdominal pain, n/v, changes in bowel movements, urinary symptoms.   Meds: Not home medications No outpatient medications have been marked as taking for the 02/18/19 encounter [facility] Encounter).     Allergies: No known drug allergies Allergies as of 02/18/2019  . (No Known Allergies)   History reviewed. No pertinent past medical history.  Family History: HTN, DM  Social History: works for [employer]. Is an every day smoker, 1/2 ppd. Endorses daily marijuana use. Drinks EtOH socially. No other substances.   Review of Systems: A complete ROS was negative except  as per HPI.   Physical Exam: Blood pressure (!) 140/91, pulse 84, temperature 98.7 F (37.1 C), temperature source Oral, resp. rate 14, SpO2 98 %. General: awake, alert, sitting up in exam chair in NAD HEENT: left lateral mandible abscess with submandibular edema. Oropharynx is clear.  Neck: no LAD CV: RRR; no murmurs, rubs or gallops Pulm: normal work of breathing; lungs CTAB Abd: BS+; abdomen is soft, non-tender, non-distended Neuro: A&Ox3; no focal deficits Ext: no edema    Assessment & Plan by Problem: Active Problems:   Dental abscess  1. Abscessed tooth with submandibular space infection - afebrile with mild leukocytosis  - managing oral secretions without difficulty - initiated on Clindamycin - appreciate oral surgery consult. Going to OR for I&D and tooth extraction; will follow-up on their recommendations  - continue pain control post-operatively   Diet: NPO DVT ppx: SCDs CODE: FULL   Dispo: Admit patient to Observation with expected length of stay less than 2 midnights.  SignedBridget Hartshorn, DO 02/18/2019, 1:58 PM  Pager: [phone]

## 2019-02-18 NOTE — ED Notes (Signed)
ED TO INPATIENT HANDOFF REPORT  ED Nurse Name and Phone #: Marsh Dolly [phone]  S Name/Age/Gender Ross Butler 23 y.o. male Room/Bed: 007C/007C  Code Status   Code Status: Not on file  Home/SNF/Other Home Patient oriented to: self, place, time and situation Is this baseline? Yes   Triage Complete: Triage complete  Chief Complaint sinus  Triage Note Patient reports continued L-sided facial pain for over a week now - seen on 3/2 for same and has finished prescribed antibiotic and steroid. He states the swelling has moved to L side of neck - erythema noted to area. Denies fevers/chills.   Allergies No Known Allergies  Level of Care/Admitting Diagnosis ED Disposition    ED Disposition Condition Comment   Admit  Hospital Area: [facility] [[id number]]  Level of Care: Med-Surg [16]  Diagnosis: Dental abscess [517616]  Admitting Physician: Tyson Alias [[id number]]  Attending Physician: Tyson Alias [phone]  PT Class (Do Not Modify): Observation [104]  PT Acc Code (Do Not Modify): Observation [10022]       B Medical/Surgery History History reviewed. No pertinent past medical history. History reviewed. No pertinent surgical history.   A IV Location/Drains/Wounds Patient Lines/Drains/Airways Status   Active Line/Drains/Airways    Name:   Placement date:   Placement time:   Site:   Days:   Peripheral IV 02/18/19 Right Hand   02/18/19    1008    Hand   less than 1          Intake/Output Last 24 hours  Intake/Output Summary (Last 24 hours) at 02/18/2019 1410 Last data filed at 02/18/2019 1408 Gross per 24 hour  Intake 50 ml  Output -  Net 50 ml    Labs/Imaging Results for orders placed or performed during the hospital encounter of 02/18/19 (from the past 48 hour(s))  CBC with Differential     Status: Abnormal   Collection Time: 02/18/19 10:03 AM  Result Value Ref Range   WBC 14.0 (H) 4.0 - 10.5 K/uL   RBC 5.43 4.22 -  5.81 MIL/uL   Hemoglobin 16.3 13.0 - 17.0 g/dL   HCT 62.7 03.5 - 00.9 %   MCV 87.8 80.0 - 100.0 fL   MCH 30.0 26.0 - 34.0 pg   MCHC 34.2 30.0 - 36.0 g/dL   RDW 38.1 (L) 82.9 - 93.7 %   Platelets 218 150 - 400 K/uL   nRBC 0.0 0.0 - 0.2 %   Neutrophils Relative % 76 %   Neutro Abs 10.6 (H) 1.7 - 7.7 K/uL   Lymphocytes Relative 14 %   Lymphs Abs 1.9 0.7 - 4.0 K/uL   Monocytes Relative 10 %   Monocytes Absolute 1.3 (H) 0.1 - 1.0 K/uL   Eosinophils Relative 0 %   Eosinophils Absolute 0.0 0.0 - 0.5 K/uL   Basophils Relative 0 %   Basophils Absolute 0.1 0.0 - 0.1 K/uL   Immature Granulocytes 0 %   Abs Immature Granulocytes 0.05 0.00 - 0.07 K/uL    Comment: Performed at [facility], [address], [place], [place] [address]  Basic metabolic panel     Status: Abnormal   Collection Time: 02/18/19 10:03 AM  Result Value Ref Range   Sodium 137 135 - 145 mmol/L   Potassium 4.2 3.5 - 5.1 mmol/L   Chloride 101 98 - 111 mmol/L   CO2 25 22 - 32 mmol/L   Glucose, Bld 91 70 - 99 mg/dL   BUN 7  6 - 20 mg/dL   Creatinine, Ser 8.10 0.61 - 1.24 mg/dL   Calcium 17.5 (H) 8.9 - 10.3 mg/dL   GFR calc non Af Amer >60 >60 mL/min   GFR calc Af Amer >60 >60 mL/min   Anion gap 11 5 - 15    Comment: Performed at [facility], [address], [place], [place] [address]   Ct Soft Tissue Neck W Contrast  Result Date: 02/18/2019 CLINICAL DATA:  Left-sided facial pain for over a week EXAM: CT NECK WITH CONTRAST TECHNIQUE: Multidetector CT imaging of the neck was performed using the standard protocol following the bolus administration of intravenous contrast. CONTRAST:  78mL OMNIPAQUE IOHEXOL 300 MG/ML  SOLN COMPARISON:  None. FINDINGS: Pharynx and larynx: Mild adenoid thickening. No submucosal edema or airway narrowing. Salivary glands: No primary inflammation. Thyroid: Negative Lymph nodes: Reactive nodal enlargement in the left more than right neck Vascular: Major venous structures are patent Limited  intracranial: Negative Visualized orbits: Negative Mastoids and visualized paranasal sinuses: Essentially clear Skeleton: Periapical erosion around tooth number tooth 19 which has a large cavity and periapical erosion. There is adjacent low-density collection along the buccal surface measuring up to 13 mm diameter at the level of the bone. Best seen on coronal reformats this collection extends into the subcutaneous left submandibular region where there is a 2.5 cm abscess that has nearly decompressed through the skin. No clear floor of mouth swelling or mass effect. There is regional cellulitis. 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 IMPRESSION: 1. Odontogenic soft tissue infection related to tooth 19 with subperiosteal abscess extending into the subcutaneous left submandibular space where there is a 2.5 cm abscess. 2. 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 Electronically Signed   By: Marnee Spring M.D.   On: 02/18/2019 12:29    Pending Labs Unresulted Labs (From admission, onward)    Start     Ordered   Pending  HIV antibody (Routine Testing)  Once,   R     Pending   Pending  CBC  Tomorrow morning,   R     Pending          Vitals/Pain Today's Vitals   02/18/19 1132 02/18/19 1134 02/18/19 1136 02/18/19 1332  BP:  (!) 140/91    Pulse:  84    Resp:  14    Temp:      TempSrc:      SpO2:  98%    PainSc: 7   7  9      Isolation Precautions No active isolations  Medications Medications  morphine 4 MG/ML injection 4 mg (4 mg Intravenous Given 02/18/19 1008)  morphine 4 MG/ML injection 4 mg (4 mg Intravenous Given 02/18/19 1137)  iohexol (OMNIPAQUE) 300 MG/ML solution 75 mL (75 mLs Intravenous Contrast Given 02/18/19 1205)  clindamycin (CLEOCIN) IVPB 600 mg (0 mg Intravenous Stopped 02/18/19 1408)  HYDROmorphone (DILAUDID) injection 0.5 mg (0.5 mg Intravenous Given 02/18/19 1334)    Mobility walks Low fall risk    Focused Assessments See HEENT assessment    R Recommendations: See Admitting Provider Note  Report given to:   Additional Notes:

## 2019-02-18 NOTE — ED Notes (Signed)
Informed consent signed and at bedside  

## 2019-02-18 NOTE — H&P (Signed)
HISTORY AND PHYSICAL  Ross Butler is a 23 y.o. male patient with left submandibuar abscess. Seen in ER today and scheduled for 23 h  admission tonight. Patient seen in ER 5 days ago and placed on Zithromycin for dental pain. Pain and swelling worsened.    1. Dental infection     History reviewed. No pertinent past medical history.  Current Facility-Administered Medications  Medication Dose Route Frequency Provider Last Rate Last Dose  . [MAR Hold] acetaminophen (TYLENOL) tablet 650 mg  650 mg Oral Q6H PRN Lanelle Bal, MD       Or  . Mitzi Hansen Hold] acetaminophen (TYLENOL) suppository 650 mg  650 mg Rectal Q6H PRN Lanelle Bal, MD      . Mitzi Hansen Hold] clindamycin (CLEOCIN) IVPB 600 mg  600 mg Intravenous Q8H Lanelle Bal, MD      . Mitzi Hansen Hold] enoxaparin (LOVENOX) injection 40 mg  40 mg Subcutaneous Q24H Lanelle Bal, MD      . Mitzi Hansen Hold] HYDROmorphone (DILAUDID) injection 0.5 mg  0.5 mg Intravenous Q4H PRN Lanelle Bal, MD   0.5 mg at 02/18/19 1427  . [MAR Hold] ketorolac (TORADOL) 30 MG/ML injection 30 mg  30 mg Intravenous Q8H PRN Lanelle Bal, MD      . lactated ringers infusion   Intravenous Continuous Val Eagle, MD       No Known Allergies Active Problems:   Dental abscess  Vitals: Blood pressure (!) 165/105, pulse 90, temperature 98.7 F (37.1 C), temperature source Oral, resp. rate 20, SpO2 98 %. Lab results: Results for orders placed or performed during the hospital encounter of 02/18/19 (from the past 24 hour(s))  CBC with Differential     Status: Abnormal   Collection Time: 02/18/19 10:03 AM  Result Value Ref Range   WBC 14.0 (H) 4.0 - 10.5 K/uL   RBC 5.43 4.22 - 5.81 MIL/uL   Hemoglobin 16.3 13.0 - 17.0 g/dL   HCT 28.3 15.1 - 76.1 %   MCV 87.8 80.0 - 100.0 fL   MCH 30.0 26.0 - 34.0 pg   MCHC 34.2 30.0 - 36.0 g/dL   RDW 60.7 (L) 37.1 - 06.2 %   Platelets 218 150 - 400 K/uL   nRBC 0.0 0.0 - 0.2 %   Neutrophils  Relative % 76 %   Neutro Abs 10.6 (H) 1.7 - 7.7 K/uL   Lymphocytes Relative 14 %   Lymphs Abs 1.9 0.7 - 4.0 K/uL   Monocytes Relative 10 %   Monocytes Absolute 1.3 (H) 0.1 - 1.0 K/uL   Eosinophils Relative 0 %   Eosinophils Absolute 0.0 0.0 - 0.5 K/uL   Basophils Relative 0 %   Basophils Absolute 0.1 0.0 - 0.1 K/uL   Immature Granulocytes 0 %   Abs Immature Granulocytes 0.05 0.00 - 0.07 K/uL  Basic metabolic panel     Status: Abnormal   Collection Time: 02/18/19 10:03 AM  Result Value Ref Range   Sodium 137 135 - 145 mmol/L   Potassium 4.2 3.5 - 5.1 mmol/L   Chloride 101 98 - 111 mmol/L   CO2 25 22 - 32 mmol/L   Glucose, Bld 91 70 - 99 mg/dL   BUN 7 6 - 20 mg/dL   Creatinine, Ser 6.94 0.61 - 1.24 mg/dL   Calcium 85.4 (H) 8.9 - 10.3 mg/dL   GFR calc non Af Amer >60 >60 mL/min   GFR calc Af Amer >60 >60 mL/min   Anion gap 11 5 - 15  Radiology Results: Dg Orthopantogram  Result Date: 02/18/2019 CLINICAL DATA:  Left facial pain.  Known odontogenic infection. EXAM: ORTHOPANTOGRAM/PANORAMIC COMPARISON:  CT of the neck 02/17/2018. FINDINGS: Dental caries involving tooth #19 and periapical lucency are again seen. No additional dental caries are evident. Soft tissue changes are better appreciated on CT. IMPRESSION: 1. Dental caries of tooth 19. 2. Periapical disease of the same tooth. Electronically Signed   By: Marin Roberts M.D.   On: 02/18/2019 14:39   Ct Soft Tissue Neck W Contrast  Result Date: 02/18/2019 CLINICAL DATA:  Left-sided facial pain for over a week EXAM: CT NECK WITH CONTRAST TECHNIQUE: Multidetector CT imaging of the neck was performed using the standard protocol following the bolus administration of intravenous contrast. CONTRAST:  26mL OMNIPAQUE IOHEXOL 300 MG/ML  SOLN COMPARISON:  None. FINDINGS: Pharynx and larynx: Mild adenoid thickening. No submucosal edema or airway narrowing. Salivary glands: No primary inflammation. Thyroid: Negative Lymph nodes: Reactive nodal  enlargement in the left more than right neck Vascular: Major venous structures are patent Limited intracranial: Negative Visualized orbits: Negative Mastoids and visualized paranasal sinuses: Essentially clear Skeleton: Periapical erosion around tooth number tooth 19 which has a large cavity and periapical erosion. There is adjacent low-density collection along the buccal surface measuring up to 13 mm diameter at the level of the bone. Best seen on coronal reformats this collection extends into the subcutaneous left submandibular region where there is a 2.5 cm abscess that has nearly decompressed through the skin. No clear floor of mouth swelling or mass effect. There is regional cellulitis. 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 IMPRESSION: 1. Odontogenic soft tissue infection related to tooth 19 with subperiosteal abscess extending into the subcutaneous left submandibular space where there is a 2.5 cm abscess. 2. 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 Electronically Signed   By: Marnee Spring M.D.   On: 02/18/2019 12:29   General appearance: alert, cooperative and no distress Head: Normocephalic, without obvious abnormality, atraumatic Eyes: negative Nose: Nares normal. Septum midline. Mucosa normal. No drainage or sinus tenderness. Throat: Decayed tooth #19. Mild trismus Pharynx clear. No fluctuance orally.  Neck: no adenopathy, supple, symmetrical, trachea midline, thyroid not enlarged, symmetric, no tenderness/mass/nodules and Moderate firm submandibular edema with pointing abscess near inferior border of mandible in molar region.  Resp: clear to auscultation bilaterally Cardio: regular rate and rhythm, S1, S2 normal, no murmur, click, rub or gallop  Assessment:  Left Submandibuar space infection, abscessed tooth #19 Plan: I and D left submandibular space, extraction tooth # 19. GA. 23h obs.   Ocie Doyne 02/18/2019

## 2019-02-18 NOTE — Discharge Summary (Signed)
Name: Ross Butler MRN: [id number] DOB: 03/10/1996 23 y.o. PCP: Patient, No Pcp Per  Date of Admission: 02/18/2019  9:27 AM Date of Discharge: 02/18/2019 Attending Physician: Dr. Erlinda Hong  Discharge Diagnosis: 1. Dental Abscess   Discharge Medications: Allergies as of 02/18/2019   No Known Allergies     Medication List    STOP taking these medications   methocarbamol 500 MG tablet Commonly known as:  ROBAXIN   nabumetone 500 MG tablet Commonly known as:  RELAFEN   naproxen 500 MG tablet Commonly known as:  Naprosyn   pseudoephedrine 120 MG 12 hr tablet Commonly known as:  Sudafed 12 Hour       Disposition and follow-up:   RossRoss Butler was discharged from [facility] in Good condition.  At the hospital follow up visit please address:  1.  Dental abscess: Underwent removal of the associated dental carry of tooth #19 and I&D. Please discuss completion of antibiotic therapy and pain.   2.  Labs / imaging needed at time of follow-up: n/a  3.  Pending labs/ test needing follow-up: n/a  Follow-up Appointments: Follow-up Information    Ocie Doyne, DDS. Schedule an appointment as soon as possible for a visit.   Specialty:  Oral Surgery Why:  Call for an early Monday Appointment Contact information: [address] [place] [address] [phone]           Hospital Course by problem list: 1. Ross Butler is a 23 y/o otherwise healthy male who presents for one week history of progressively worsening left sided facial pain. Initially he had pain in his left jaw and maxillary region along with URI symptoms. He was seen in the ED 4 days ago and was given Azithromycin for sinusitis. States that these symptoms improved initially but worsened after stopping therapy. Upon presentation a physical exam and CT head/neck demonstrated a soft tissue abscess associated with tooth 19. Orthodontics was contacted and Dr. Barbette Merino DDS  recommended admission. Following admission, underwent surgical removal of the affected tooth and was placed on oral antibiotics. He was then discharged on Clindamycin and Oxycodone for pain control.   Discharge Vitals:   BP (!) 146/75 (BP Location: Right Arm)   Pulse 67   Temp (!) 97.2 F (36.2 C)   Resp 12   SpO2 100%   Pertinent Labs, Studies, and Procedures:  CBC Latest Ref Rng & Units 02/18/2019  WBC 4.0 - 10.5 K/uL 14.0(H)  Hemoglobin 13.0 - 17.0 g/dL 24.2  Hematocrit 35.3 - 52.0 % 47.7  Platelets 150 - 400 K/uL 218   CT Soft Tissue Neck W Contrast: IMPRESSION: 1. Odontogenic soft tissue infection related to tooth 19 with subperiosteal abscess extending into the subcutaneous left submandibular space where there is a 2.5 cm abscess. 2. 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  DG Orthopantogram: FINDINGS: Dental caries involving tooth #19 and periapical lucency are again seen. No additional dental caries are evident. Soft tissue changes are better appreciated on CT. IMPRESSION: 1. Dental caries of tooth 19. 2. Periapical disease of the same tooth.  Discharge Instructions: Discharge Instructions    Diet - low sodium heart healthy   Complete by:  As directed    Discharge instructions   Complete by:  As directed    Please continue your antibiotics until they are gone. Please take three tablets at a time, three times per day for seven days.   Increase activity slowly   Complete by:  As directed       Signed: Lanelle Bal, MD 02/20/2019, 9:47 AM   Pager: Pager# [phone]

## 2019-02-19 ENCOUNTER — Other Ambulatory Visit: Payer: Self-pay | Admitting: Student in an Organized Health Care Education/Training Program

## 2019-02-19 ENCOUNTER — Encounter (HOSPITAL_COMMUNITY): Payer: Self-pay | Admitting: Oral Surgery

## 2019-02-19 ENCOUNTER — Telehealth: Payer: Self-pay | Admitting: Surgery

## 2019-02-19 MED ORDER — OXYCODONE-ACETAMINOPHEN 5-325 MG PO TABS: 1.0000 | ORAL_TABLET | Freq: Three times a day (TID) | ORAL | 0 refills | Status: DC | PRN

## 2019-02-19 MED ORDER — CLINDAMYCIN HCL 150 MG PO CAPS: 450.0000 mg | ORAL_CAPSULE | Freq: Three times a day (TID) | ORAL | 0 refills | Status: DC

## 2019-02-19 MED ORDER — CLINDAMYCIN HCL 150 MG PO CAPS: 450.0000 mg | ORAL_CAPSULE | Freq: Three times a day (TID) | ORAL | 0 refills | Status: AC

## 2019-02-19 MED ORDER — OXYCODONE-ACETAMINOPHEN 5-325 MG PO TABS: 1.0000 | ORAL_TABLET | Freq: Four times a day (QID) | ORAL | 0 refills | Status: AC | PRN

## 2019-02-19 NOTE — Op Note (Signed)
NAMEKENZIE, PASTRAN MEDICAL RECORD UG:[id number] ACCOUNT [id number] DATE OF BIRTH:Jul 22, 1996 FACILITY: [facility] LOCATION: [facility] PHYSICIAN:Kaycee Haycraft M. Safia Panzer, DDS  OPERATIVE REPORT  DATE OF PROCEDURE:  02/18/2019  PREOPERATIVE DIAGNOSIS:  Left submandibular space infection abscessed tooth #19.  POSTOPERATIVE DIAGNOSIS:  Left submandibular space infection abscessed tooth #19.  PROCEDURES:   1.  Extraction of tooth #19. 2.  Incision and drainage of left submandibular space abscess.  SURGEON:  Ocie Doyne, DDS  ANESTHESIA:  General, oral intubation, Dr. Maple Hudson attending.  DESCRIPTION OF PROCEDURE:  The patient was taken to the operating room and placed on the table in supine position.  General anesthesia was administered intravenously and an oral endotracheal tube was placed and secured.  The eyes were protected.  The  patient was prepped and draped for surgery.  A timeout was performed.  The left mandible was palpated and there was a large pointing abscess in the area of the inferior border of the mandible in the molar region.  Local anesthesia 2% lidocaine, 1:100,000  epinephrine was infiltrated deep to this pointing abscess and circumferentially around it.  Then, a 15 blade was used to make an incision.  Frank purulence was expressed and a hemostat was inserted into the incision and approximately 5 mL of frank pus  was removed and suctioned.  Cultures, aerobic and anaerobic were taken at the initial incision.    When the area was drained, attention was turned to the oral cavity.  A bite block was placed inside of the mouth.  Sweetheart retractor used to retract the tongue and a throat pack was placed and 2% lidocaine, 1:100,000 epinephrine was infiltrated in an  inferior alveolar block and buccal and lingual infiltration around tooth #19.  An incision was made in the gingival sulcus buccally and lingually around tooth #19.  The mucosa was elevated and then the tooth was elevated with a  301 elevator and removed  from the mouth with the #17 dental forceps.  Then the socket was curetted, irrigated.  No closure was necessary.  The oral cavity was irrigated and suctioned and a throat pack was removed.  Then, a quarter inch Penrose was placed in the extraoral  incision site and placed into the depth of bone.  Prior to placing the Penrose the area was irrigated and suctioned.  Then, 3-0 nylon was used to secure the Penrose to the skin and then the area was dressed with bacitracin and 4 x 4s    The patient was left in care of anesthesia for extubation and transport to recovery room with plans for 23-hour observation.  ESTIMATED BLOOD LOSS:  Minimal.  COMPLICATIONS:  None.  SPECIMENS:  None.  AN/NUANCE  D:02/18/2019 T:02/19/2019 JOB:[id number]

## 2019-02-19 NOTE — Telephone Encounter (Signed)
ED CM received call from patient concerning his discharge prescriptions not being able to be filled as per [facility]  patient was discharged from [facility]. ED CM contacted Dr. Oswaldo Done prescriptions were resent to pharmacy. Updated patient no further ED CM needs identified.

## 2019-02-23 LAB — AEROBIC/ANAEROBIC CULTURE W GRAM STAIN (SURGICAL/DEEP WOUND): Culture: NORMAL

## 2019-02-25 ENCOUNTER — Encounter (HOSPITAL_COMMUNITY): Payer: Self-pay | Admitting: Emergency Medicine

## 2019-02-25 ENCOUNTER — Other Ambulatory Visit: Payer: Self-pay

## 2019-02-25 ENCOUNTER — Emergency Department (HOSPITAL_COMMUNITY)
Admission: EM | Admit: 2019-02-25 | Discharge: 2019-02-25 | Disposition: A | Discharge status: Home / Self Care | Payer: PRIVATE HEALTH INSURANCE | Attending: Emergency Medicine | Admitting: Emergency Medicine

## 2019-02-25 DIAGNOSIS — F17210 Nicotine dependence, cigarettes, uncomplicated: Secondary | ICD-10-CM | POA: Diagnosis not present

## 2019-02-25 DIAGNOSIS — Z5189 Encounter for other specified aftercare: Secondary | ICD-10-CM

## 2019-02-25 DIAGNOSIS — Z4800 Encounter for change or removal of nonsurgical wound dressing: Secondary | ICD-10-CM | POA: Insufficient documentation

## 2019-02-25 NOTE — ED Triage Notes (Signed)
Pt to ER for evaluation of left facial surgical site completed over one week ago for evacuation of dental abscess. Reports was changing his dressing this morning and the packing was removed by accident. Here to have replaced.

## 2019-02-25 NOTE — Discharge Instructions (Addendum)
You have been diagnosed today with Encounter for Wound Check.  At this time there does not appear to be the presence of an emergent medical condition, however there is always the potential for conditions to change. Please read and follow the below instructions.  Please return to the Emergency Department immediately for any new or worsening symptoms. Please be sure to follow up with your Primary Care Provider within one week regarding your visit today; please call their office to schedule an appointment even if you are feeling better for a follow-up visit. Please continue to change your wound dressing as previously instructed with clean dry dressings.  You may use the packing given to you today as we discussed, small amount of clean packing gently on wound during each dressing change.  Do not attempt to push packing deep into the wound, just on surface as we discussed gently. Continue take your antibiotic as previously prescribed and go to your appointment with your surgeon on Monday for wound recheck.  Get help right away if: You have a red streak going away from your wound. The edges of the wound open up and separate. Your wound is bleeding, and the bleeding does not stop with gentle pressure. You have a rash. You faint. You have trouble breathing. Get help right away if: Your pain is not controlled with pain medicine. The tissue inside your wound changes color from pink to white, yellow, or black. You have a fever. You have shaking chills. You are having trouble packing your wound. Any new or concerning symptoms  Please read the additional information packets attached to your discharge summary.

## 2019-02-25 NOTE — ED Provider Notes (Signed)
[facility] EMERGENCY DEPARTMENT Provider Note   CSN: [id number] Arrival date & time: 02/25/19  1004    History   Chief Complaint Chief Complaint  Patient presents with   Wound Check    HPI Ross Butler is a 23 y.o. male presents today for wound check.   Patient underwent tooth extraction and incision and drainage of the left submandibular space abscess on 02/18/2019.  Wound was packed at that time and patient was started on antibiotic.  Patient reports that this morning when he was changing his dressing the packing fell out which concerned him so he presents for wound recheck.  Patient states that otherwise he has been feeling very well he states that his previous pain has resolved and he has been taking his antibiotic as prescribed.  Patient denies any new or worsening symptoms, denies fever, difficulty swallowing or difficulty breathing.  Patient reports that he has had follow-up appointment with his surgeon on 02/27/2019.     HPI  Past Medical History:  Diagnosis Date   Medical history non-contributory     Patient Active Problem List   Diagnosis Date Noted   Dental abscess 02/18/2019   Tooth abscess 02/18/2019    Past Surgical History:  Procedure Laterality Date   NO PAST SURGERIES     TOOTH EXTRACTION N/A 02/18/2019   Procedure: DENTAL EXTRACTION TOOTH 19 AND DRAINAGE OF ABSCESS;  Surgeon: Ocie Doyne, DDS;  Location: [facility] OR;  Service: Oral Surgery;  Laterality: N/A;        Home Medications    Prior to Admission medications   Medication Sig Start Date End Date Taking? Authorizing Provider  clindamycin (CLEOCIN) 150 MG capsule Take 3 capsules (450 mg total) by mouth 3 (three) times daily for 10 days. 02/19/19 03/01/19  Tyson Alias, MD  oxyCODONE-acetaminophen (PERCOCET) 5-325 MG tablet Take 1 tablet by mouth every 6 (six) hours as needed for severe pain. 02/19/19   Tyson Alias, MD    Family History History reviewed.  No pertinent family history.  Social History Social History   Tobacco Use   Smoking status: Current Every Day Smoker   Smokeless tobacco: Never Used  Substance Use Topics   Alcohol use: Yes    Comment: occ   Drug use: Yes    Types: Marijuana     Allergies   Patient has no known allergies.   Review of Systems Review of Systems  Constitutional: Negative.  Negative for chills, fatigue and fever.  HENT: Negative.  Negative for drooling, facial swelling, rhinorrhea, sore throat, trouble swallowing and voice change.   Eyes: Negative.  Negative for visual disturbance.  Respiratory: Negative.  Negative for cough, choking and shortness of breath.   Gastrointestinal: Negative.  Negative for abdominal pain, nausea and vomiting.  Musculoskeletal: Negative.  Negative for back pain and neck pain.  Neurological: Negative.  Negative for weakness and headaches.   Physical Exam Updated Vital Signs BP 121/80 (BP Location: Right Arm)    Pulse 65    Temp 98.1 F (36.7 C) (Oral)    Resp 18    SpO2 99%   Physical Exam Constitutional:      General: He is not in acute distress.    Appearance: Normal appearance. He is well-developed. He is not ill-appearing or diaphoretic.  HENT:     Head: Normocephalic and atraumatic.     Jaw: There is normal jaw occlusion. No trismus.     Right Ear: External ear normal.  Left Ear: External ear normal.     Nose: Nose normal.     Mouth/Throat:      Comments: Patient with missing tooth as indicated, no drainage present. - Patient with 1 cm in diameter wound to left submandibular area, scant drainage, approximately 5- 8 mm in depth, no tracking to deeper spaces seen on exam.  See picture below. - The patient has normal phonation and is in control of secretions. No stridor.  Midline uvula without edema. Soft palate rises symmetrically. No tonsillar erythema, swelling or exudates. Tongue protrusion is normal, floor of mouth is soft. No trismus. No  creptius on neck palpation. No gingival erythema or fluctuance noted. Mucus membranes moist. Eyes:     General: Vision grossly intact. Gaze aligned appropriately.     Conjunctiva/sclera: Conjunctivae normal.     Pupils: Pupils are equal, round, and reactive to light.  Neck:     Musculoskeletal: Full passive range of motion without pain, normal range of motion and neck supple.     Trachea: Trachea and phonation normal. No tracheal tenderness or tracheal deviation.  Pulmonary:     Effort: Pulmonary effort is normal. No respiratory distress.  Abdominal:     General: There is no distension.     Palpations: Abdomen is soft.     Tenderness: There is no abdominal tenderness. There is no guarding or rebound.  Musculoskeletal: Normal range of motion.  Skin:    General: Skin is warm and dry.  Neurological:     Mental Status: He is alert.     GCS: GCS eye subscore is 4. GCS verbal subscore is 5. GCS motor subscore is 6.     Comments: Speech is clear and goal oriented, follows commands Major Cranial nerves without deficit, no facial droop Moves extremities without ataxia, coordination intact Normal gait  Psychiatric:        Behavior: Behavior normal.        ED Treatments / Results  Labs (all labs ordered are listed, but only abnormal results are displayed) Labs Reviewed - No data to display  EKG None  Radiology No results found.  Procedures Procedures (including critical care time)  Medications Ordered in ED Medications - No data to display   Initial Impression / Assessment and Plan / ED Course  I have reviewed the triage vital signs and the nursing notes.  Pertinent labs & imaging results that were available during my care of the patient were reviewed by me and considered in my medical decision making (see chart for details).    23 year old male presenting for wound recheck after surgical drainage of submandibular abscess.  He has been healing well per patient and he  reports taking his antibiotics as prescribed.  No fever or worsening symptoms.  Patient overall very well-appearing and in no acute distress.  He is here today because his packing fell of his wound during dressing change.  He has an appointment with his surgeon in 2 days.  Wound overall well-appearing today appears to be healing.  Patient has been given iodoform packing material today and educated on its use, gently packing on to surface of wound, no deep packing, and keep covered with clean dry gauze.  It is likely that the packing will continue to follow out due to how shallow the wound is however feel that giving patient packing and education will help with wound healing.  Patient states understanding of care plan and is agreeable.  He states that he will follow-up with the  surgeon in 2 days.  Encouraged to complete antibiotic course.  At this time there does not appear to be any evidence of an acute emergency medical condition and the patient appears stable for discharge with appropriate outpatient follow up. Diagnosis was discussed with patient who verbalizes understanding of care plan and is agreeable to discharge. I have discussed return precautions with patient who verbalizes understanding of return precautions. Patient strongly encouraged to follow-up with their PCP and surgeon All questions answered.   Note: Portions of this report may have been transcribed using voice recognition software. Every effort was made to ensure accuracy; however, inadvertent computerized transcription errors may still be present.  Final Clinical Impressions(s) / ED Diagnoses   Final diagnoses:  Visit for wound check    ED Discharge Orders    None       Elizabeth Palau 02/25/19 1054    Donnetta Hutching, MD 02/25/19 1103

## 2019-03-30 ENCOUNTER — Encounter (HOSPITAL_COMMUNITY): Payer: Self-pay

## 2019-03-30 ENCOUNTER — Other Ambulatory Visit: Payer: Self-pay

## 2019-03-30 ENCOUNTER — Emergency Department (HOSPITAL_COMMUNITY)
Admission: EM | Admit: 2019-03-30 | Discharge: 2019-03-30 | Disposition: A | Discharge status: Home / Self Care | Payer: PRIVATE HEALTH INSURANCE | Attending: Emergency Medicine | Admitting: Emergency Medicine

## 2019-03-30 DIAGNOSIS — F1290 Cannabis use, unspecified, uncomplicated: Secondary | ICD-10-CM | POA: Insufficient documentation

## 2019-03-30 DIAGNOSIS — S5011XA Contusion of right forearm, initial encounter: Secondary | ICD-10-CM | POA: Insufficient documentation

## 2019-03-30 DIAGNOSIS — Z23 Encounter for immunization: Secondary | ICD-10-CM | POA: Insufficient documentation

## 2019-03-30 DIAGNOSIS — W503XXA Accidental bite by another person, initial encounter: Secondary | ICD-10-CM

## 2019-03-30 DIAGNOSIS — Y939 Activity, unspecified: Secondary | ICD-10-CM | POA: Diagnosis not present

## 2019-03-30 DIAGNOSIS — S51831A Puncture wound without foreign body of right forearm, initial encounter: Secondary | ICD-10-CM | POA: Diagnosis present

## 2019-03-30 DIAGNOSIS — Y929 Unspecified place or not applicable: Secondary | ICD-10-CM | POA: Diagnosis not present

## 2019-03-30 DIAGNOSIS — F17210 Nicotine dependence, cigarettes, uncomplicated: Secondary | ICD-10-CM | POA: Diagnosis not present

## 2019-03-30 DIAGNOSIS — Y999 Unspecified external cause status: Secondary | ICD-10-CM | POA: Diagnosis not present

## 2019-03-30 MED ORDER — AMOXICILLIN-POT CLAVULANATE 875-125 MG PO TABS: 1.0000 | ORAL_TABLET | Freq: Two times a day (BID) | ORAL | 0 refills | Status: DC

## 2019-03-30 MED ORDER — TETANUS-DIPHTH-ACELL PERTUSSIS 5-2.5-18.5 LF-MCG/0.5 IM SUSP
0.5000 mL | Freq: Once | INTRAMUSCULAR | Status: AC
  Administered 2019-03-30: 0.5 mL via INTRAMUSCULAR
  Filled 2019-03-30: qty 0.5
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
## 2019-03-30 NOTE — Discharge Instructions (Addendum)
Please read attached information. If you experience any new or worsening signs or symptoms please return to the emergency room for evaluation. Please follow-up with your primary care provider or specialist as discussed. Please use medication prescribed only as directed and discontinue taking if you have any concerning signs or symptoms.   °
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
## 2019-08-04 ENCOUNTER — Other Ambulatory Visit: Payer: Self-pay

## 2019-08-04 ENCOUNTER — Emergency Department (HOSPITAL_COMMUNITY)
Admission: EM | Admit: 2019-08-04 | Discharge: 2019-08-04 | Disposition: A | Discharge status: Home / Self Care | Payer: PRIVATE HEALTH INSURANCE | Attending: Emergency Medicine | Admitting: Emergency Medicine

## 2019-08-04 DIAGNOSIS — F17200 Nicotine dependence, unspecified, uncomplicated: Secondary | ICD-10-CM | POA: Insufficient documentation

## 2019-08-04 DIAGNOSIS — R369 Urethral discharge, unspecified: Secondary | ICD-10-CM | POA: Diagnosis not present

## 2019-08-04 MED ORDER — CEFTRIAXONE SODIUM 250 MG IJ SOLR
250.0000 mg | Freq: Once | INTRAMUSCULAR | Status: AC
  Administered 2019-08-04: 17:00:00 250 mg via INTRAMUSCULAR
  Filled 2019-08-04: qty 250

## 2019-08-04 MED ORDER — STERILE WATER FOR INJECTION IJ SOLN
INTRAMUSCULAR | Status: AC
  Administered 2019-08-04: 1 mL
  Filled 2019-08-04: qty 10

## 2019-08-04 MED ORDER — AZITHROMYCIN 1 G PO PACK
1.0000 g | PACK | Freq: Once | ORAL | Status: AC
  Administered 2019-08-04: 1 g via ORAL
  Filled 2019-08-04: qty 1

## 2019-08-04 NOTE — ED Provider Notes (Signed)
[facility]   CSN: [id number] Arrival date & time: 08/04/19  1613     History   Chief Complaint No chief complaint on file.   HPI Ross Butler is a 23 y.o. male.     23 year old male presents with complaint of white urethral discharge with urinating.  Patient believes he has an STD from his new male sexual partner.  Patient does not use condoms.  Patient denies any sores or lesions, denies pain in his penis or testicles, denies dysuria, frequency, hematuria.  No other complaints or concerns.  Patient has a past history of gonorrhea was treated in 2018.     Past Medical History:  Diagnosis Date  . Medical history non-contributory     Patient Active Problem List   Diagnosis Date Noted  . Dental abscess 02/18/2019  . Tooth abscess 02/18/2019    Past Surgical History:  Procedure Laterality Date  . NO PAST SURGERIES    . TOOTH EXTRACTION N/A 02/18/2019   Procedure: DENTAL EXTRACTION TOOTH 19 AND DRAINAGE OF ABSCESS;  Surgeon: Ocie DoyneJensen, Scott, DDS;  Location: [facility] OR;  Service: Oral Surgery;  Laterality: N/A;        Home Medications    Prior to Admission medications   Medication Sig Start Date End Date Taking? Authorizing Provider  amoxicillin-clavulanate (AUGMENTIN) 875-125 MG tablet Take 1 tablet by mouth every 12 (twelve) hours. 03/30/19   Hedges, Tinnie GensJeffrey, PA-C  oxyCODONE-acetaminophen (PERCOCET) 5-325 MG tablet Take 1 tablet by mouth every 6 (six) hours as needed for severe pain. 02/19/19   Tyson AliasVincent, Duncan Thomas, MD    Family History No family history on file.  Social History Social History   Tobacco Use  . Smoking status: Current Every Day Smoker  . Smokeless tobacco: Never Used  Substance Use Topics  . Alcohol use: Yes    Comment: occ  . Drug use: Yes    Types: Marijuana     Allergies   Patient has no known allergies.   Review of Systems Review of Systems  Constitutional: Negative for  fever.  Gastrointestinal: Negative for abdominal pain.  Genitourinary: Positive for discharge. Negative for dysuria, frequency, genital sores, hematuria, penile pain, penile swelling, scrotal swelling, testicular pain and urgency.  Musculoskeletal: Negative for arthralgias and myalgias.  Skin: Negative for rash and wound.  Allergic/Immunologic: Negative for immunocompromised state.  Neurological: Negative for weakness.  Psychiatric/Behavioral: Negative for confusion.  All other systems reviewed and are negative.    Physical Exam Updated Vital Signs BP 128/68   Pulse 72   Temp 98.2 F (36.8 C) (Oral)   Resp 16   Ht 5' 6.5" (1.689 m)   Wt 76.2 kg   SpO2 97%   BMI 26.71 kg/m   Physical Exam Vitals signs and nursing note reviewed. Exam conducted with a chaperone present.  Constitutional:      General: He is not in acute distress.    Appearance: He is well-developed. He is not diaphoretic.  HENT:     Head: Normocephalic and atraumatic.  Pulmonary:     Effort: Pulmonary effort is normal.  Genitourinary:    Penis: Normal and circumcised.      Scrotum/Testes: Normal.  Neurological:     Mental Status: He is alert and oriented to person, place, and time.  Psychiatric:        Behavior: Behavior normal.      ED Treatments / Results  Labs (all labs ordered are listed, but only abnormal results  are displayed) Labs Reviewed  GC/CHLAMYDIA PROBE AMP ([place]) NOT AT [facility]    EKG None  Radiology No results found.  Procedures Procedures (including critical care time)  Medications Ordered in ED Medications  cefTRIAXone (ROCEPHIN) injection 250 mg (250 mg Intramuscular Given 08/04/19 1724)  azithromycin (ZITHROMAX) powder 1 g (1 g Oral Given 08/04/19 1724)  sterile water (preservative free) injection (1 mL  Given 08/04/19 1724)     Initial Impression / Assessment and Plan / ED Course  I have reviewed the triage vital signs and the nursing notes.  Pertinent labs &  imaging results that were available during my care of the patient were reviewed by me and considered in my medical decision making (see chart for details).  Clinical Course as of Aug 04 1743  Fri Aug 03, 3773  4625 23 year old male with complaint of white urethral discharge with voiding.  On exam there is no discharge present at this time, no swelling or tenderness.  Urethral swab for gonorrhea chlamydia was collected with RN chaperone present.  Patient was given Zithromax and Rocephin, advised to abstain from intercourse.  Advised if any of his test results are positive his partner may be treated for free at the health department.   [LM]    Clinical Course User Index [LM] Tacy Learn, PA-C      Final Clinical Impressions(s) / ED Diagnoses   Final diagnoses:  Urethral discharge    ED Discharge Orders    None       Roque Lias 08/04/19 1744    Lucrezia Starch, MD 08/05/19 [phone]

## 2019-08-04 NOTE — ED Notes (Signed)
Pt left before d/c instructions and paper work could be given to pt from this RN

## 2019-08-04 NOTE — Discharge Instructions (Addendum)
Abstain from intercourse until you know your test results. Call the hospital in 3-5 days for your results. IF your gonorrhea and chlamydia tests are negative, you may resume intercourse. IF either test is positive, your partner will need to go to the health department for free treatment. IF your test is positive, both partners need to abstain from intercourse for 10 days after treatment (this includes all forms of intercourse).  ° °

## 2019-08-04 NOTE — ED Triage Notes (Signed)
Pt believes he caught an STD from a male. Pt states when I pee white stuff comes out.

## 2019-08-04 NOTE — ED Notes (Addendum)
Went to obtain DC vitals but PT not in room

## 2019-10-29 ENCOUNTER — Encounter (HOSPITAL_COMMUNITY): Payer: Self-pay

## 2019-10-29 ENCOUNTER — Other Ambulatory Visit: Payer: Self-pay

## 2019-10-29 ENCOUNTER — Emergency Department (HOSPITAL_COMMUNITY)
Admission: EM | Admit: 2019-10-29 | Discharge: 2019-10-29 | Disposition: A | Discharge status: Home / Self Care | Payer: Self-pay | Attending: Emergency Medicine | Admitting: Emergency Medicine

## 2019-10-29 ENCOUNTER — Emergency Department (HOSPITAL_COMMUNITY): Payer: Self-pay

## 2019-10-29 DIAGNOSIS — M545 Low back pain, unspecified: Secondary | ICD-10-CM

## 2019-10-29 DIAGNOSIS — F17200 Nicotine dependence, unspecified, uncomplicated: Secondary | ICD-10-CM | POA: Insufficient documentation

## 2019-10-29 MED ORDER — NAPROXEN 250 MG PO TABS
500.0000 mg | ORAL_TABLET | Freq: Once | ORAL | Status: AC
  Administered 2019-10-29: 500 mg via ORAL
  Filled 2019-10-29: qty 2

## 2019-10-29 MED ORDER — CYCLOBENZAPRINE HCL 10 MG PO TABS: 10.0000 mg | ORAL_TABLET | Freq: Two times a day (BID) | ORAL | 0 refills | Status: AC | PRN

## 2019-10-29 MED ORDER — CYCLOBENZAPRINE HCL 10 MG PO TABS
5.0000 mg | ORAL_TABLET | Freq: Once | ORAL | Status: AC
  Administered 2019-10-29: 21:00:00 5 mg via ORAL
  Filled 2019-10-29: qty 1

## 2019-10-29 MED ORDER — NAPROXEN 500 MG PO TABS: 500.0000 mg | ORAL_TABLET | Freq: Two times a day (BID) | ORAL | 0 refills | Status: DC

## 2019-10-29 NOTE — Discharge Instructions (Addendum)
You are seen today for back pain.  Your x-rays were normal.  I think that you have some strains in your muscles from previous heavy lifting and this can be treated with alternating between ice and heat and taking anti-inflammatory medications.

## 2019-10-29 NOTE — ED Triage Notes (Signed)
Patient complains of thoracic and lower back pain x 1 month. Denies trauma, but previous work was a lot of lifting. Pain worse with movemnt

## 2019-10-29 NOTE — ED Notes (Signed)
Patient verbalizes understanding of discharge instructions. Opportunity for questioning and answers were provided. Armband removed by staff, pt discharged from ED.  

## 2019-10-29 NOTE — ED Provider Notes (Signed)
[facility] Provider Note   CSN: [id number] Arrival date & time: 10/29/19  1739     History   Chief Complaint No chief complaint on file.   HPI Ross Butler is a 23 y.o. male.     Patient is a 23 year old male with no significant past medical history presenting to the emergency department for back pain.  Patient reports that the back pain has been going on for a month.  Reports that he was doing heavy lifting at his job but has no specific injury or trauma.  Reports that the pain is mostly in the middle and lower part of the back and is worse with movement and better with rest.  Denies any fever, radiation into the legs, numbness, tingling, weakness, saddle anesthesia, loss of control of bowel or bladder movement.  He has tried some over-the-counter ibuprofen with some improvement.     Past Medical History:  Diagnosis Date   Medical history non-contributory     Patient Active Problem List   Diagnosis Date Noted   Dental abscess 02/18/2019   Tooth abscess 02/18/2019    Past Surgical History:  Procedure Laterality Date   NO PAST SURGERIES     TOOTH EXTRACTION N/A 02/18/2019   Procedure: DENTAL EXTRACTION TOOTH 19 AND DRAINAGE OF ABSCESS;  Surgeon: Ocie DoyneJensen, Scott, DDS;  Location: [facility] OR;  Service: Oral Surgery;  Laterality: N/A;        Home Medications    Prior to Admission medications   Medication Sig Start Date End Date Taking? Authorizing Provider  amoxicillin-clavulanate (AUGMENTIN) 875-125 MG tablet Take 1 tablet by mouth every 12 (twelve) hours. 03/30/19   Hedges, Tinnie GensJeffrey, PA-C  cyclobenzaprine (FLEXERIL) 10 MG tablet Take 1 tablet (10 mg total) by mouth 2 (two) times daily as needed for up to 7 days for muscle spasms. 10/29/19 11/05/19  Ronnie DossMcLean, Wilkins Elpers A, PA-C  naproxen (NAPROSYN) 500 MG tablet Take 1 tablet (500 mg total) by mouth 2 (two) times daily. 10/29/19   Arlyn DunningMcLean, Samah Lapiana A, PA-C  oxyCODONE-acetaminophen  (PERCOCET) 5-325 MG tablet Take 1 tablet by mouth every 6 (six) hours as needed for severe pain. 02/19/19   Tyson AliasVincent, Duncan Thomas, MD    Family History No family history on file.  Social History Social History   Tobacco Use   Smoking status: Current Every Day Smoker   Smokeless tobacco: Never Used  Substance Use Topics   Alcohol use: Yes    Comment: occ   Drug use: Yes    Types: Marijuana     Allergies   Patient has no known allergies.   Review of Systems Review of Systems  Constitutional: Negative for fever.  HENT: Negative for congestion.   Respiratory: Negative for cough and shortness of breath.   Cardiovascular: Negative for chest pain.  Gastrointestinal: Negative for nausea and vomiting.  Genitourinary: Negative for dysuria.  Musculoskeletal: Positive for back pain. Negative for arthralgias, gait problem, joint swelling, myalgias, neck pain and neck stiffness.  Neurological: Negative for dizziness, light-headedness and numbness.     Physical Exam Updated Vital Signs BP 121/81 (BP Location: Right Arm)    Pulse (!) 58    Temp 97.9 F (36.6 C) (Oral)    Resp 19    SpO2 100%   Physical Exam Vitals signs and nursing note reviewed.  Constitutional:      Appearance: Normal appearance.  HENT:     Head: Normocephalic.     Nose: Nose normal.  Mouth/Throat:     Mouth: Mucous membranes are moist.  Eyes:     Conjunctiva/sclera: Conjunctivae normal.  Cardiovascular:     Rate and Rhythm: Normal rate and regular rhythm.  Pulmonary:     Effort: Pulmonary effort is normal.  Abdominal:     General: Abdomen is flat.  Musculoskeletal:        General: Tenderness present. No swelling or deformity.       Back:     Right lower leg: No edema.     Left lower leg: No edema.  Skin:    General: Skin is dry.  Neurological:     General: No focal deficit present.     Mental Status: He is alert.     Cranial Nerves: No cranial nerve deficit.     Sensory: No sensory  deficit.     Motor: No weakness.     Coordination: Coordination normal.     Gait: Gait normal.     Deep Tendon Reflexes: Reflexes normal.  Psychiatric:        Mood and Affect: Mood normal.      ED Treatments / Results  Labs (all labs ordered are listed, but only abnormal results are displayed) Labs Reviewed - No data to display  EKG None  Radiology Dg Thoracic Spine 2 View  Result Date: 10/29/2019 CLINICAL DATA:  Back pain EXAM: THORACIC SPINE 2 VIEWS COMPARISON:  None. FINDINGS: There is no evidence of thoracic spine fracture. Alignment is normal. No other significant bone abnormalities are identified. IMPRESSION: Negative. Electronically Signed   By: Rolm Baptise M.D.   On: 10/29/2019 20:37   Dg Lumbar Spine Complete  Result Date: 10/29/2019 CLINICAL DATA:  Back pain EXAM: LUMBAR SPINE - COMPLETE 4+ VIEW COMPARISON:  None. FINDINGS: There is no evidence of lumbar spine fracture. Alignment is normal. Intervertebral disc spaces are maintained. IMPRESSION: Negative. Electronically Signed   By: Rolm Baptise M.D.   On: 10/29/2019 20:38    Procedures Procedures (including critical care time)  Medications Ordered in ED Medications  naproxen (NAPROSYN) tablet 500 mg (has no administration in time range)  cyclobenzaprine (FLEXERIL) tablet 5 mg (has no administration in time range)     Initial Impression / Assessment and Plan / ED Course  I have reviewed the triage vital signs and the nursing notes.  Pertinent labs & imaging results that were available during my care of the patient were reviewed by me and considered in my medical decision making (see chart for details).  Clinical Course as of Oct 29 2043  Nancy Fetter Oct 29, 2019  2044 Patient was evaluated for back pain today. Patient has no concerning symptoms or physical exam findings including no fever, no loss of control of bowel or bladder, no urinary retention, no saddle anesthesia, no leg weakness and no pain radiation into  the legs. She was given medication to treat her symptoms and advised to f/u with PMD for further workup including possible PT, medication change, further imaging, etc. She was advised on all concerning symptoms above and to return to the ED if any of them arise.       [KM]    Clinical Course User Index [KM] Alveria Apley, PA-C       Based on review of vitals, medical screening exam, lab work and/or imaging, there does not appear to be an acute, emergent etiology for the patient's symptoms. Counseled pt on good return precautions and encouraged both PCP and ED follow-up as needed.  Prior to discharge, I also discussed incidental imaging findings with patient in detail and advised appropriate, recommended follow-up in detail.  Clinical Impression: 1. Acute left-sided low back pain without sciatica     Disposition: Discharge  Prior to providing a prescription for a controlled substance, I independently reviewed the patient's recent prescription history on the [place] Controlled Substance Reporting System. The patient had no recent or regular prescriptions and was deemed appropriate for a brief, less than 3 day prescription of narcotic for acute analgesia.  This note was prepared with assistance of Conservation officer, historic buildings. Occasional wrong-word or sound-a-like substitutions may have occurred due to the inherent limitations of voice recognition software.   Final Clinical Impressions(s) / ED Diagnoses   Final diagnoses:  Acute left-sided low back pain without sciatica    ED Discharge Orders         Ordered    cyclobenzaprine (FLEXERIL) 10 MG tablet  2 times daily PRN     10/29/19 2044    naproxen (NAPROSYN) 500 MG tablet  2 times daily     10/29/19 2044           Jeral Pinch 10/29/19 2044    Rolan Bucco, MD 10/29/19 2117

## 2020-01-22 ENCOUNTER — Emergency Department (HOSPITAL_COMMUNITY): Payer: Self-pay

## 2020-01-22 ENCOUNTER — Emergency Department (HOSPITAL_COMMUNITY)
Admission: EM | Admit: 2020-01-22 | Discharge: 2020-01-22 | Disposition: A | Discharge status: Home / Self Care | Payer: Self-pay | Attending: Emergency Medicine | Admitting: Emergency Medicine

## 2020-01-22 ENCOUNTER — Encounter (HOSPITAL_COMMUNITY): Payer: Self-pay | Admitting: Emergency Medicine

## 2020-01-22 ENCOUNTER — Other Ambulatory Visit: Payer: Self-pay

## 2020-01-22 DIAGNOSIS — F17210 Nicotine dependence, cigarettes, uncomplicated: Secondary | ICD-10-CM | POA: Insufficient documentation

## 2020-01-22 DIAGNOSIS — Z711 Person with feared health complaint in whom no diagnosis is made: Secondary | ICD-10-CM | POA: Insufficient documentation

## 2020-01-22 DIAGNOSIS — R0789 Other chest pain: Secondary | ICD-10-CM | POA: Insufficient documentation

## 2020-01-22 DIAGNOSIS — Z79899 Other long term (current) drug therapy: Secondary | ICD-10-CM | POA: Insufficient documentation

## 2020-01-22 LAB — URINALYSIS, ROUTINE W REFLEX MICROSCOPIC
Bilirubin Urine: NEGATIVE
Glucose, UA: NEGATIVE mg/dL
Hgb urine dipstick: NEGATIVE
Ketones, ur: NEGATIVE mg/dL
Nitrite: NEGATIVE
Protein, ur: 30 mg/dL — AB
Specific Gravity, Urine: 1.025 (ref 1.005–1.030)
WBC, UA: >50 WBC/hpf — ABNORMAL HIGH (ref 0–5)
pH: 5 (ref 5.0–8.0)

## 2020-01-22 MED ORDER — STERILE WATER FOR INJECTION IJ SOLN
INTRAMUSCULAR | Status: AC
  Administered 2020-01-22: 0.5 mL
  Filled 2020-01-22: qty 10

## 2020-01-22 MED ORDER — CEFTRIAXONE SODIUM 500 MG IJ SOLR
250.0000 mg | Freq: Once | INTRAMUSCULAR | Status: AC
  Administered 2020-01-22: 250 mg via INTRAMUSCULAR
  Filled 2020-01-22: qty 500

## 2020-01-22 MED ORDER — KETOROLAC TROMETHAMINE 30 MG/ML IJ SOLN
30.0000 mg | Freq: Once | INTRAMUSCULAR | Status: AC
  Administered 2020-01-22: 30 mg via INTRAMUSCULAR
  Filled 2020-01-22: qty 1

## 2020-01-22 MED ORDER — NAPROXEN 500 MG PO TABS: 500.0000 mg | ORAL_TABLET | Freq: Two times a day (BID) | ORAL | 0 refills | Status: DC

## 2020-01-22 MED ORDER — DOXYCYCLINE HYCLATE 100 MG PO CAPS: 100.0000 mg | ORAL_CAPSULE | Freq: Two times a day (BID) | ORAL | 0 refills | Status: AC

## 2020-01-22 NOTE — Discharge Instructions (Addendum)
We will contact you with the results of your lab work when it is available. Take the naproxen as needed for pain. Return to the ED if you start to experience worsening pain, shortness of breath, lightheadedness or leg swelling.

## 2020-01-22 NOTE — ED Triage Notes (Signed)
Pt endorses penile discharge today. Also endorses pain on right breast when coughing or moving arm around.

## 2020-01-22 NOTE — ED Provider Notes (Signed)
[facility] EMERGENCY DEPARTMENT Provider Note   CSN: [id number] Arrival date & time: 01/22/20  4098     History Chief Complaint  Patient presents with  . SEXUALLY TRANSMITTED DISEASE  . Chest Pain    Ross Butler is a 24 y.o. male who presents to ED for multiple complaints. His first complaint is pinpoint right-sided chest pain.  It has been going on for the past 4 to 5 days and is worse with movement of his right arm and coughing.  He is concerned that he may have a "chest cold" as multiple family members have been sick with URI symptoms.  He has not tried any medications to help with his symptoms.  He denies any shortness of breath, pleuritic chest pain, leg swelling, history of DVT PE or MI, abdominal pain, vomiting or fever, recent immobilization. He also complains of penile discharge that he noticed today.  He had unprotected sexual intercourse with a partner approximately 1 week ago and is concerned that he has an STD.  He reports history of similar symptoms in the past which was treated with antibiotics.  Denies any penile pain or swelling, testicular pain or swelling, rashes or lesions.  HPI     Past Medical History:  Diagnosis Date  . Medical history non-contributory     Patient Active Problem List   Diagnosis Date Noted  . Dental abscess 02/18/2019  . Tooth abscess 02/18/2019    Past Surgical History:  Procedure Laterality Date  . NO PAST SURGERIES    . TOOTH EXTRACTION N/A 02/18/2019   Procedure: DENTAL EXTRACTION TOOTH 19 AND DRAINAGE OF ABSCESS;  Surgeon: Ocie Doyne, DDS;  Location: [facility] OR;  Service: Oral Surgery;  Laterality: N/A;       No family history on file.  Social History   Tobacco Use  . Smoking status: Current Every Day Smoker  . Smokeless tobacco: Never Used  Substance Use Topics  . Alcohol use: Yes    Comment: occ  . Drug use: Yes    Types: Marijuana    Home Medications Prior to Admission medications     Medication Sig Start Date End Date Taking? Authorizing Provider  cyclobenzaprine (FLEXERIL) 5 MG tablet Take 2.5-5 mg by mouth at bedtime as needed. 11/15/19  Yes [provider]  nabumetone (RELAFEN) 500 MG tablet Take 500-1,000 mg by mouth 2 (two) times daily. 12/18/19  Yes [provider]  doxycycline (VIBRAMYCIN) 100 MG capsule Take 1 capsule (100 mg total) by mouth 2 (two) times daily for 7 days. 01/22/20 01/29/20  Clarine Elrod, PA-C  naproxen (NAPROSYN) 500 MG tablet Take 1 tablet (500 mg total) by mouth 2 (two) times daily. 01/22/20   Katai Marsico, PA-C  oxyCODONE-acetaminophen (PERCOCET) 5-325 MG tablet Take 1 tablet by mouth every 6 (six) hours as needed for severe pain. Patient not taking: Reported on 01/22/2020 02/19/19   Tyson Alias, MD    Allergies    Patient has no known allergies.  Review of Systems   Review of Systems  Constitutional: Negative for appetite change, chills and fever.  HENT: Negative for ear pain, rhinorrhea, sneezing and sore throat.   Eyes: Negative for photophobia and visual disturbance.  Respiratory: Positive for cough. Negative for chest tightness, shortness of breath and wheezing.   Cardiovascular: Positive for chest pain. Negative for palpitations.  Gastrointestinal: Negative for abdominal pain, blood in stool, constipation, diarrhea, nausea and vomiting.  Genitourinary: Positive for discharge. Negative for dysuria, hematuria, penile swelling, scrotal  swelling, testicular pain and urgency.  Musculoskeletal: Negative for myalgias.  Skin: Negative for rash.  Neurological: Negative for dizziness, weakness and light-headedness.    Physical Exam Updated Vital Signs BP 125/80 (BP Location: Right Arm)   Pulse (!) 57   Temp 98.2 F (36.8 C) (Oral)   Resp (!) 22   Ht 5\' 7"  (1.702 m)   Wt 84.4 kg   SpO2 100%   BMI 29.13 kg/m   Physical Exam Vitals and nursing note reviewed. Exam conducted with a chaperone present.  Constitutional:       General: He is not in acute distress.    Appearance: He is well-developed. He is not diaphoretic.  HENT:     Head: Normocephalic and atraumatic.  Eyes:     General: No scleral icterus.    Conjunctiva/sclera: Conjunctivae normal.  Cardiovascular:     Rate and Rhythm: Normal rate and regular rhythm.     Heart sounds: Normal heart sounds.  Pulmonary:     Effort: Pulmonary effort is normal. No respiratory distress.     Breath sounds: Normal breath sounds.  Chest:     Chest wall: Tenderness present.       Comments: Pinpoint area of tenderness on the right chest wall without palpable swelling. Genitourinary:    Penis: Normal.      Testes: Normal.     Comments: Normal male genitalia noted. Penis, scrotum, and testicles without swelling, lesions, rashes, or tenderness present. No penile discharge noted. Cremasteric reflex intact. RN served as Producer, television/film/video during the exam.  Musculoskeletal:     Cervical back: Normal range of motion.     Right lower leg: No edema.     Left lower leg: No edema.  Skin:    Findings: No rash.  Neurological:     Mental Status: He is alert.     ED Results / Procedures / Treatments   Labs (all labs ordered are listed, but only abnormal results are displayed) Labs Reviewed  URINALYSIS, ROUTINE W REFLEX MICROSCOPIC - Abnormal; Notable for the following components:      Result Value   APPearance HAZY (*)    Protein, ur 30 (*)    Leukocytes,Ua MODERATE (*)    WBC, UA >50 (*)    Bacteria, UA FEW (*)    All other components within normal limits  URINE CULTURE  GC/CHLAMYDIA PROBE AMP ([place]) NOT AT [facility]    EKG EKG Interpretation  Date/Time:  Monday January 22 2020 10:02:51 EST Ventricular Rate:  74 PR Interval:    QRS Duration: 95 QT Interval:  379 QTC Calculation: 421 R Axis:   74 Text Interpretation: Sinus rhythm Right atrial enlargement Consider right ventricular hypertrophy ST elev, probable normal early repol pattern No significant  change since last tracing Confirmed by Blanchie Dessert [phone]) on 01/22/2020 10:07:08 AM   Radiology DG Chest 2 View  Result Date: 01/22/2020 CLINICAL DATA:  Chest pain and cough EXAM: CHEST - 2 VIEW COMPARISON:  May 19, 2017 FINDINGS: Lungs are clear. Heart size and pulmonary vascularity are normal. No adenopathy. No pneumothorax. No bone lesions. IMPRESSION: No abnormality noted. Electronically Signed   By: Lowella Grip III M.D.   On: 01/22/2020 11:10    Procedures Procedures (including critical care time)  Medications Ordered in ED Medications  cefTRIAXone (ROCEPHIN) injection 250 mg (250 mg Intramuscular Given 01/22/20 1111)  ketorolac (TORADOL) 30 MG/ML injection 30 mg (30 mg Intramuscular Given 01/22/20 1111)  sterile water (preservative free) injection (0.5 mLs  Given 01/22/20 1112)    ED Course  I have reviewed the triage vital signs and the nursing notes.  Pertinent labs & imaging results that were available during my care of the patient were reviewed by me and considered in my medical decision making (see chart for details).    MDM Rules/Calculators/A&P                      24 year old male presenting to the ED for multiple complaints.  In regards to his penile discharge he is concerned about STDs.  Urinalysis with leukocytes and bacteria, sent for culture but I feel his symptoms are most likely due to STD.  Given treatment for gonorrhea and chlamydia with Rocephin and doxycycline. Regarding his chest pain he is tender in one pinpoint area of his right chest wall.  Denies exertional pain, shortness of breath, leg swelling, history of DVT, PE or MI.  EKG without any changes from prior tracings.  Chest x-ray unremarkable.  He is low risk based on his heart score and is PERC negative.  His vital signs are reassuring.  I doubt ACS, PE or other emergent cause of his symptoms.  His symptoms are most likely due to musculoskeletal chest wall pain.  He has some improvement with  anti-inflammatories here.  We will have him continue anti-inflammatories at home, give doxycycline due to new guidelines for STD treatment and have him follow-up with PCP.  Patient is hemodynamically stable, in NAD, and able to ambulate in the ED. Evaluation does not show pathology that would require ongoing emergent intervention or inpatient treatment. I explained the diagnosis to the patient. Pain has been managed and has no complaints prior to discharge. Patient is comfortable with above plan and is stable for discharge at this time. All questions were answered prior to disposition. Strict return precautions for returning to the ED were discussed. Encouraged follow up with PCP.   An After Visit Summary was printed and given to the patient.   Portions of this note were generated with Scientist, clinical (histocompatibility and immunogenetics). Dictation errors may occur despite best attempts at proofreading.  Final Clinical Impression(s) / ED Diagnoses Final diagnoses:  Concern about STD in male without diagnosis  Chest wall pain    Rx / DC Orders ED Discharge Orders         Ordered    doxycycline (VIBRAMYCIN) 100 MG capsule  2 times daily     01/22/20 1044    naproxen (NAPROSYN) 500 MG tablet  2 times daily     01/22/20 1044           Dietrich Pates, PA-C 01/22/20 1156    Gwyneth Sprout, MD 01/23/20 1500

## 2020-01-22 NOTE — ED Notes (Signed)
Got patient into a gown on the monitor did ekg shown to Dr Plunkett patient is resting with call bell in reach 

## 2020-01-23 LAB — URINE CULTURE: Culture: NO GROWTH

## 2020-03-03 ENCOUNTER — Other Ambulatory Visit: Payer: Self-pay

## 2020-03-03 ENCOUNTER — Emergency Department (HOSPITAL_COMMUNITY): Payer: Self-pay

## 2020-03-03 ENCOUNTER — Encounter (HOSPITAL_COMMUNITY): Payer: Self-pay | Admitting: Emergency Medicine

## 2020-03-03 ENCOUNTER — Emergency Department (HOSPITAL_COMMUNITY)
Admission: EM | Admit: 2020-03-03 | Discharge: 2020-03-03 | Disposition: A | Discharge status: Home / Self Care | Payer: Medicaid Other | Attending: Emergency Medicine | Admitting: Emergency Medicine

## 2020-03-03 DIAGNOSIS — I861 Scrotal varices: Secondary | ICD-10-CM | POA: Insufficient documentation

## 2020-03-03 DIAGNOSIS — F17210 Nicotine dependence, cigarettes, uncomplicated: Secondary | ICD-10-CM | POA: Insufficient documentation

## 2020-03-03 DIAGNOSIS — Z79899 Other long term (current) drug therapy: Secondary | ICD-10-CM | POA: Insufficient documentation

## 2020-03-03 LAB — URINALYSIS, ROUTINE W REFLEX MICROSCOPIC
Bacteria, UA: NONE SEEN
Bilirubin Urine: NEGATIVE
Glucose, UA: NEGATIVE mg/dL
Hgb urine dipstick: NEGATIVE
Ketones, ur: NEGATIVE mg/dL
Leukocytes,Ua: NEGATIVE
Nitrite: NEGATIVE
Protein, ur: 30 mg/dL — AB
Specific Gravity, Urine: 1.026 (ref 1.005–1.030)
pH: 6 (ref 5.0–8.0)

## 2020-03-03 NOTE — ED Triage Notes (Signed)
Pt reports swollen lump to his scrotum x2 weeks, states that lump went down in size and then got larger again. Denies any redness, dysuria, discharge, fevers or chills. Denies any known trauma or injuries.

## 2020-03-03 NOTE — Discharge Instructions (Signed)
Your ultrasound shows that you have a varicocele, this is a collection of enlarged blood vessels, and usually these do not cause any problems but I would like for you to follow-up with a urologist regarding this especially if it continues to cause you any discomfort.  These can sometimes increase in size and you may note more swelling in the scrotum.

## 2020-03-03 NOTE — ED Notes (Signed)
Patient verbalizes understanding of discharge instructions. Opportunity for questioning and answers were provided. Armband removed by staff, pt discharged from ED.  

## 2020-03-03 NOTE — ED Provider Notes (Signed)
[facility] EMERGENCY DEPARTMENT Provider Note   CSN: [id number] Arrival date & time: 03/03/20  1146     History Chief Complaint  Patient presents with  . Groin Swelling    Deondra Wigger is a 24 y.o. male.  Dohn Stclair is a 24 y.o. male who is otherwise healthy, presents to the emergency department for evaluation of a lump he noted in his scrotum.  Patient states that he first noticed it about 2 weeks ago, but it seemed to go down and was not really bothering him and then for a few days it was painful.  Patient states after about 3 days the pain went away but then not remained and he states it has gotten larger in size.  He states is present on the back of his left testicle.  He denies any associated redness or swelling.  Denies any burning or pain with urination, no penile discharge.  No fevers or chills.  No new sexual partners.  Denies any trauma or injury to the testicle.  No history of similar. No abdominal pain.        Past Medical History:  Diagnosis Date  . Medical history non-contributory     Patient Active Problem List   Diagnosis Date Noted  . Dental abscess 02/18/2019  . Tooth abscess 02/18/2019    Past Surgical History:  Procedure Laterality Date  . NO PAST SURGERIES    . TOOTH EXTRACTION N/A 02/18/2019   Procedure: DENTAL EXTRACTION TOOTH 19 AND DRAINAGE OF ABSCESS;  Surgeon: Diona Browner, DDS;  Location: [facility];  Service: Oral Surgery;  Laterality: N/A;       No family history on file.  Social History   Tobacco Use  . Smoking status: Current Every Day Smoker  . Smokeless tobacco: Never Used  Substance Use Topics  . Alcohol use: Yes    Comment: occ  . Drug use: Yes    Types: Marijuana    Home Medications Prior to Admission medications   Medication Sig Start Date End Date Taking? Authorizing Provider  nabumetone (RELAFEN) 500 MG tablet Take 500-1,000 mg by mouth 2 (two) times daily. 12/18/19  Yes [provider]  naproxen (NAPROSYN) 500 MG tablet Take 1 tablet (500 mg total) by mouth 2 (two) times daily. 01/22/20  Yes Khatri, Hina, PA-C  cyclobenzaprine (FLEXERIL) 5 MG tablet Take 2.5-5 mg by mouth at bedtime as needed. 11/15/19   [provider]  oxyCODONE-acetaminophen (PERCOCET) 5-325 MG tablet Take 1 tablet by mouth every 6 (six) hours as needed for severe pain. Patient not taking: Reported on 03/03/2020 02/19/19   Axel Filler, MD    Allergies    Patient has no known allergies.  Review of Systems   Review of Systems  Constitutional: Negative for chills and fever.  HENT: Negative.   Gastrointestinal: Negative for abdominal pain, nausea and vomiting.  Genitourinary: Positive for testicular pain. Negative for discharge, dysuria, flank pain, frequency, genital sores, hematuria, penile pain, penile swelling and scrotal swelling.  Skin: Negative for color change and rash.  All other systems reviewed and are negative.   Physical Exam Updated Vital Signs BP (!) 143/82   Pulse 77   Temp 98.4 F (36.9 C) (Oral)   Resp 15   SpO2 100%   Physical Exam Vitals and nursing note reviewed.  Constitutional:      General: He is not in acute distress.    Appearance: Normal appearance. He is well-developed and normal weight. He is  not ill-appearing or diaphoretic.  HENT:     Head: Normocephalic and atraumatic.  Eyes:     General:        Right eye: No discharge.        Left eye: No discharge.  Pulmonary:     Effort: Pulmonary effort is normal. No respiratory distress.  Abdominal:     General: Abdomen is flat. Bowel sounds are normal. There is no distension.     Palpations: Abdomen is soft. There is no mass.     Tenderness: There is no abdominal tenderness. There is no guarding.     Hernia: No hernia is present.     Comments: Abdomen soft, nondistended, nontender to palpation in all quadrants without guarding or peritoneal signs  Genitourinary:    Comments: Chaperone present  during genital exam. Penis normal. Spongy palpable mass behind the left testicle, left testicle itself appears normal, and is comparable in size to the right testicle, body mass is nontender to palpation, there is no overlying erythema or swelling.  No evident inguinal hernias. Musculoskeletal:        General: No deformity.  Skin:    General: Skin is warm and dry.  Neurological:     Mental Status: He is alert and oriented to person, place, and time.     Coordination: Coordination normal.  Psychiatric:        Mood and Affect: Mood normal.        Behavior: Behavior normal.     ED Results / Procedures / Treatments   Labs (all labs ordered are listed, but only abnormal results are displayed) Labs Reviewed  URINALYSIS, ROUTINE W REFLEX MICROSCOPIC - Abnormal; Notable for the following components:      Result Value   Protein, ur 30 (*)    All other components within normal limits    EKG None  Radiology US SCROTUM W/DOPPLER  Result Date: 03/03/2020 CLINICAL DATA:  Left testicular pain. EXAM: SCROTAL ULTRASOUND DOPPLER ULTRASOUND OF THE TESTICLES TECHNIQUE: Complete ultrasound examination of the testicles, epididymis, and other scrotal structures was performed. Color and spectral Doppler ultrasound were also utilized to evaluate blood flow to the testicles. COMPARISON:  None. FINDINGS: Right testicle Measurements: 4.4 x 2.7 x 2.0 cm. No mass or microlithiasis visualized. Left testicle Measurements: 4.5 x 2.9 x 1.9 cm. No mass or microlithiasis visualized. Right epididymis:  Normal in size and appearance. Left epididymis:  Normal in size and appearance. Hydrocele:  None visualized. Varicocele:  Mild left varicocele is noted. Pulsed Doppler interrogation of both testes demonstrates normal low resistance arterial and venous waveforms bilaterally. IMPRESSION: No evidence of testicular mass or torsion. Mild left varicocele is noted. Electronically Signed   By: Lupita Raider M.D.   On: 03/03/2020  15:33    Procedures Procedures (including critical care time)  Medications Ordered in ED Medications - No data to display  ED Course  I have reviewed the triage vital signs and the nursing notes.  Pertinent labs & imaging results that were available during my care of the patient were reviewed by me and considered in my medical decision making (see chart for details).    MDM Rules/Calculators/A&P                     24 year old male presents with left-sided scrotal mass has been present for 2 weeks and intermittently painful.  No associated urinary symptoms.  On exam there is a spongy mass behind the left testicle likely a varicocele  or spermatocele.  Will get urinalysis and scrotal ultrasound.  Urinalysis without any signs of infection, no hematuria.  Ultrasound consistent with varicocele with no evident testicular masses.  Provided patient reassuring results and will have him follow-up with urology.  Return precautions discussed.  Patient expressed understanding and agreement.  Discharged home in good condition.  Final Clinical Impression(s) / ED Diagnoses Final diagnoses:  Varicocele    Rx / DC Orders ED Discharge Orders    None       Legrand Rams 03/03/20 1646    Mesner, Barbara Cower, MD 03/04/20 [phone]

## 2020-07-11 ENCOUNTER — Other Ambulatory Visit: Payer: Self-pay

## 2020-07-11 ENCOUNTER — Ambulatory Visit (HOSPITAL_COMMUNITY)
Admission: EM | Admit: 2020-07-11 | Discharge: 2020-07-11 | Disposition: A | Discharge status: Home / Self Care | Payer: Medicaid Other | Attending: Internal Medicine | Admitting: Internal Medicine

## 2020-07-11 DIAGNOSIS — Z20822 Contact with and (suspected) exposure to covid-19: Secondary | ICD-10-CM | POA: Diagnosis present

## 2020-07-11 NOTE — Discharge Instructions (Addendum)
Self quarantine, rest,push fluids

## 2020-07-11 NOTE — ED Triage Notes (Addendum)
Pt requesting COVID testing after being around COVID+ sibling 1 week ago, no symptoms

## 2020-07-11 NOTE — ED Provider Notes (Signed)
[facility]    CSN: [id number] Arrival date & time: 07/11/20  1330      History   Chief Complaint Chief Complaint  Patient presents with  . Covid Exposure    HPI Ross Butler is a 24 y.o. male.   25 yr old male pt, unvaccinated for COVID presents to UC after recent exposure to 2 family members w whom he lives that tested positive for COVID.   The history is provided by the patient. No language interpreter was used.    Past Medical History:  Diagnosis Date  . Medical history non-contributory     Patient Active Problem List   Diagnosis Date Noted  . Contact with and (suspected) exposure to covid-19 07/11/2020  . Dental abscess 02/18/2019  . Tooth abscess 02/18/2019    Past Surgical History:  Procedure Laterality Date  . NO PAST SURGERIES    . TOOTH EXTRACTION N/A 02/18/2019   Procedure: DENTAL EXTRACTION TOOTH 19 AND DRAINAGE OF ABSCESS;  Surgeon: Ocie Doyne, DDS;  Location: [facility] OR;  Service: Oral Surgery;  Laterality: N/A;       Home Medications    Prior to Admission medications   Medication Sig Start Date End Date Taking? Authorizing Provider  cyclobenzaprine (FLEXERIL) 5 MG tablet Take 2.5-5 mg by mouth at bedtime as needed. 11/15/19   [provider]  nabumetone (RELAFEN) 500 MG tablet Take 500-1,000 mg by mouth 2 (two) times daily. 12/18/19   [provider]  naproxen (NAPROSYN) 500 MG tablet Take 1 tablet (500 mg total) by mouth 2 (two) times daily. 01/22/20   Khatri, Hina, PA-C  oxyCODONE-acetaminophen (PERCOCET) 5-325 MG tablet Take 1 tablet by mouth every 6 (six) hours as needed for severe pain. Patient not taking: Reported on 03/03/2020 02/19/19   Tyson Alias, MD    Family History No family history on file.  Social History Social History   Tobacco Use  . Smoking status: Current Every Day Smoker  . Smokeless tobacco: Never Used  Substance Use Topics  . Alcohol use: Yes    Comment: occ  . Drug use: Yes     Types: Marijuana     Allergies   Patient has no known allergies.   Review of Systems Review of Systems  All other systems reviewed and are negative.    Physical Exam Triage Vital Signs ED Triage Vitals [07/11/20 1450]  Enc Vitals Group     BP (!) 147/98     Pulse Rate 61     Resp 16     Temp 98 F (36.7 C)     Temp src      SpO2 100 %     Weight      Height      Head Circumference      Peak Flow      Pain Score 0     Pain Loc      Pain Edu?      Excl. in GC?    No data found.  Updated Vital Signs BP (!) 147/98   Pulse 61   Temp 98 F (36.7 C)   Resp 16   SpO2 100%    Physical Exam Vitals and nursing note reviewed.  Constitutional:      Appearance: Normal appearance.  HENT:     Head: Normocephalic.     Right Ear: Tympanic membrane normal.     Nose: Nose normal.     Mouth/Throat:     Mouth: Mucous membranes are  moist.  Eyes:     Pupils: Pupils are equal, round, and reactive to light.  Cardiovascular:     Rate and Rhythm: Normal rate and regular rhythm.     Pulses: Normal pulses.     Heart sounds: Normal heart sounds.  Pulmonary:     Effort: Pulmonary effort is normal.     Breath sounds: Normal breath sounds.  Musculoskeletal:     Cervical back: Normal range of motion.  Skin:    General: Skin is warm.     Capillary Refill: Capillary refill takes less than 2 seconds.     Findings: No rash.  Neurological:     General: No focal deficit present.     Mental Status: He is alert and oriented to person, place, and time.     Motor: Motor function is intact.     Coordination: Coordination is intact.     Gait: Gait is intact.  Psychiatric:        Mood and Affect: Mood normal.        Behavior: Behavior normal. Behavior is cooperative.      UC Treatments / Results  Labs (all labs ordered are listed, but only abnormal results are displayed) Labs Reviewed  SARS CORONAVIRUS 2 (TAT 6-24 HRS)    EKG   Radiology No results  found.  Procedures Procedures (including critical care time)  Medications Ordered in UC Medications - No data to display  Initial Impression / Assessment and Plan / UC Course  I have reviewed the triage vital signs and the nursing notes.  Pertinent labs & imaging results that were available during my care of the patient were reviewed by me and considered in my medical decision making (see chart for details).      Final Clinical Impressions(s) / UC Diagnoses   Final diagnoses:  Contact with and (suspected) exposure to covid-19     Discharge Instructions     Self quarantine, rest,push fluids    ED Prescriptions    None     I have reviewed the PDMP during this encounter.   Clancy Gourd, NP 07/11/20 1644

## 2020-07-12 LAB — SARS CORONAVIRUS 2 (TAT 6-24 HRS): SARS Coronavirus 2: NEGATIVE

## 2020-11-24 ENCOUNTER — Encounter (HOSPITAL_COMMUNITY): Payer: Self-pay | Admitting: Emergency Medicine

## 2020-11-24 ENCOUNTER — Other Ambulatory Visit: Payer: Self-pay

## 2020-11-24 ENCOUNTER — Emergency Department (HOSPITAL_COMMUNITY)
Admission: EM | Admit: 2020-11-24 | Discharge: 2020-11-24 | Disposition: A | Discharge status: Home / Self Care | Payer: Medicaid Other | Attending: Emergency Medicine | Admitting: Emergency Medicine

## 2020-11-24 DIAGNOSIS — Z5321 Procedure and treatment not carried out due to patient leaving prior to being seen by health care provider: Secondary | ICD-10-CM | POA: Insufficient documentation

## 2020-11-24 DIAGNOSIS — R6884 Jaw pain: Secondary | ICD-10-CM | POA: Insufficient documentation

## 2020-11-24 NOTE — ED Triage Notes (Signed)
Pt reports intermittent swelling to L side of face "when it gets cold outside" since having surgery to dental abscess 2 years ago.  Pt reports intermittent pain to L jaw with swelling.  Denies pain at present.

## 2020-11-24 NOTE — ED Notes (Signed)
Pt stated that he is leaving. Informed Jamie - RN.

## 2020-11-24 NOTE — ED Notes (Signed)
Patient states that he doesn't want to wait

## 2020-12-23 IMAGING — DX DG ORTHOPANTOGRAM /PANORAMIC
1 series · 1 of 1 positions shown · non-contrast
Comparison: CT of the neck 02/17/2018.

CLINICAL DATA: Left facial pain.  Known odontogenic infection.

EXAM:
ORTHOPANTOGRAM/PANORAMIC

[view not recorded]
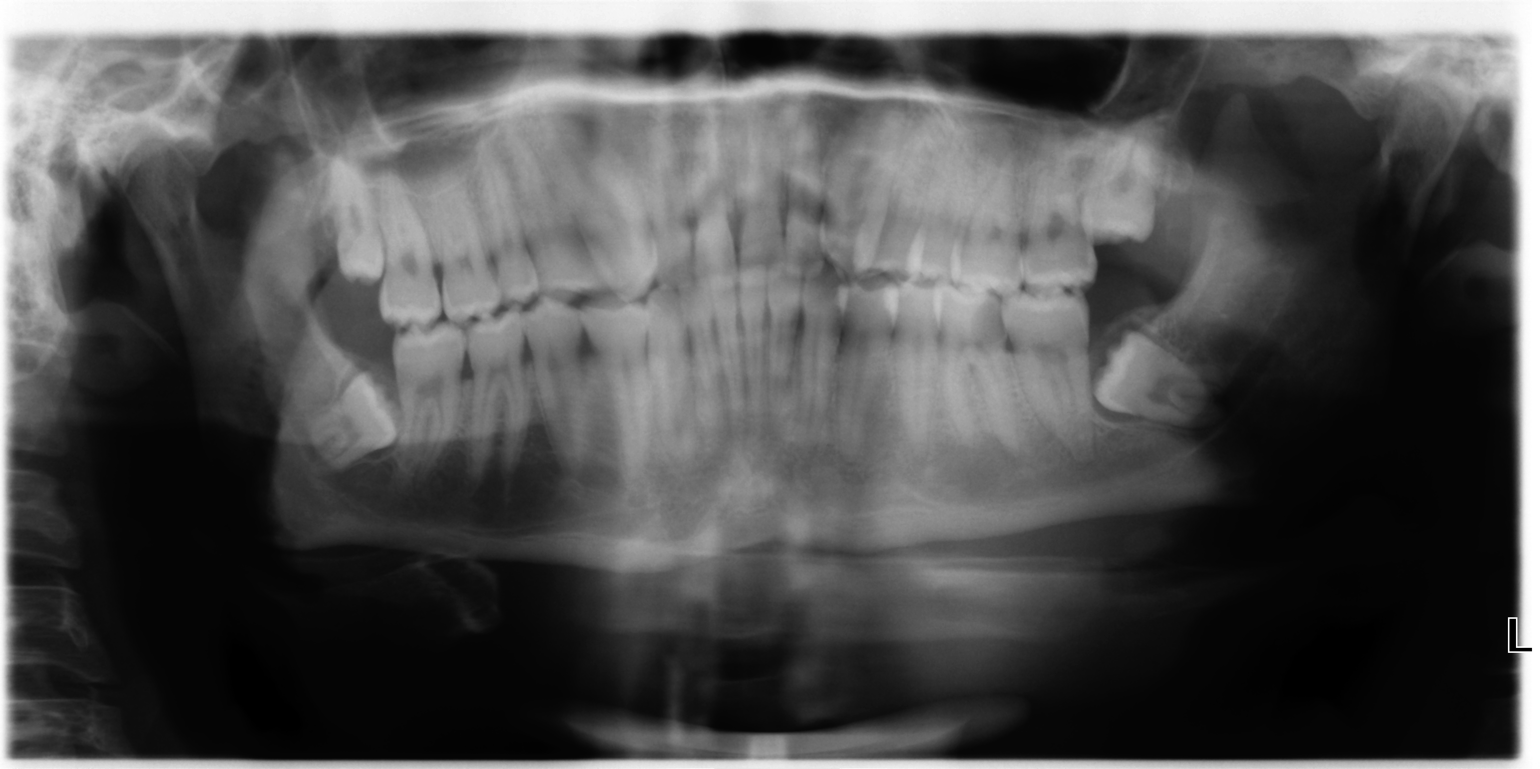

[1 of 1 positions shown; findings below may reference images not displayed]

FINDINGS: Dental caries involving tooth #19 and periapical lucency are again
seen. No additional dental caries are evident. Soft tissue changes
are better appreciated on CT.
IMPRESSION: 1. Dental caries of tooth 19.
2. Periapical disease of the same tooth.

## 2020-12-23 IMAGING — CT CT NECK W/ CM
4 of 6 series · 14 of 33 positions shown, 16 images · IV contrast (Omni 300)
Comparison: None.

CLINICAL DATA: Left-sided facial pain for over a week

EXAM:
CT NECK WITH CONTRAST
TECHNIQUE: Multidetector CT imaging of the neck was performed using the
standard protocol following the bolus administration of intravenous
contrast.
CONTRAST:  75mL OMNIPAQUE IOHEXOL 300 MG/ML  SOLN

[Series 3: neck 2.0 st · axial · 0.52mm/px · z∈[-242,-118]mm · 3 of 124 slices shown, 4 images (1 of 3)]
[im 31/124  soft-tissue]
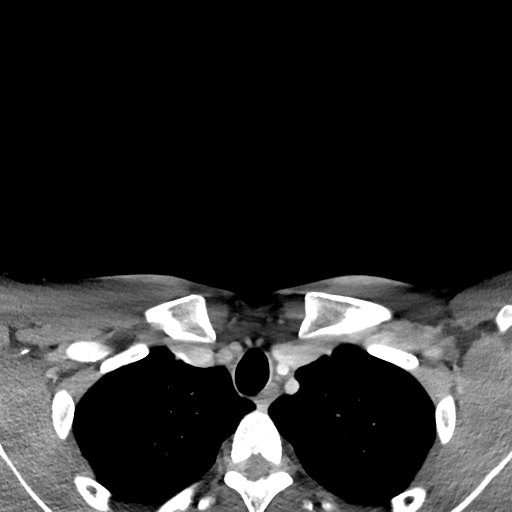
[im 31/124  bone]
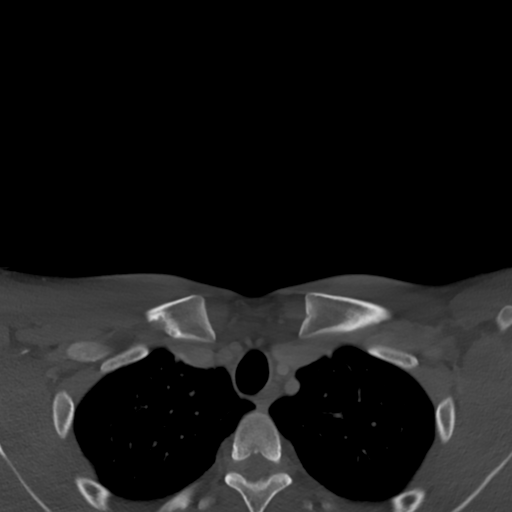
[im 62/124  bone]
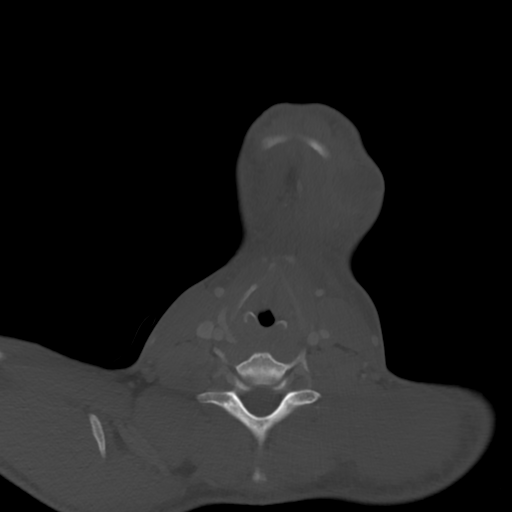
[im 93/124  bone]
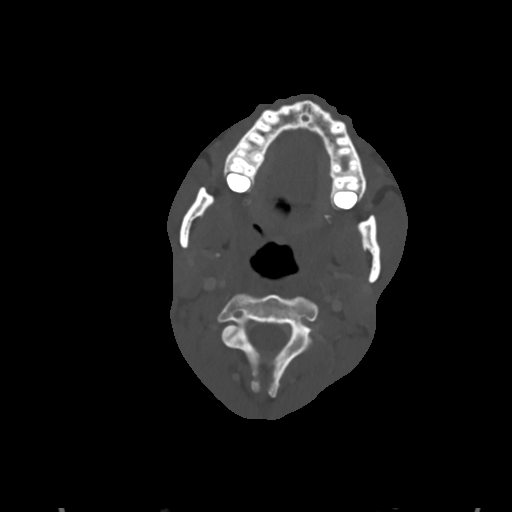

[Series 5: neck 2.0 st · sagittal · 0.51mm/px · 5 of 101 slices shown, 6 images (2 of 3)]
[im 34/101  bone]
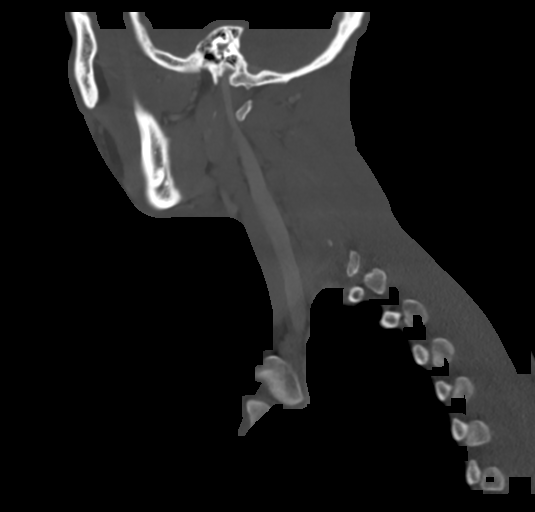
[im 42/101  bone]
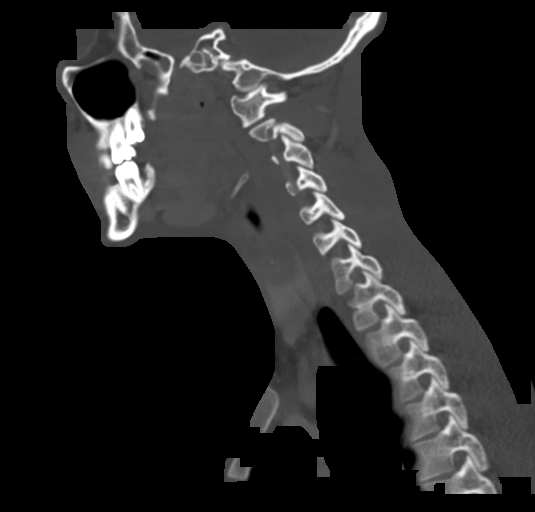
[im 51/101  soft-tissue]
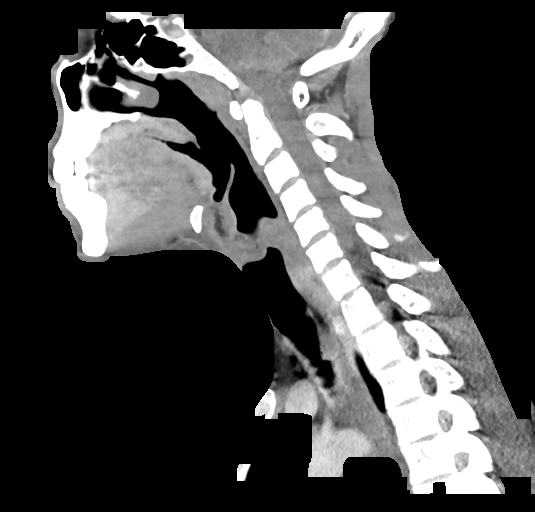
[im 51/101  bone]
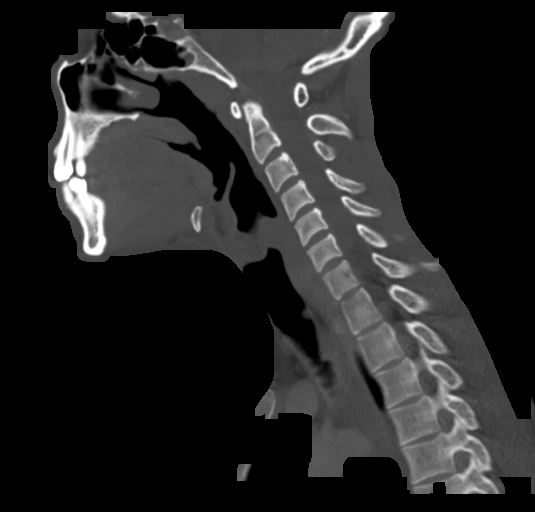
[im 59/101  bone]
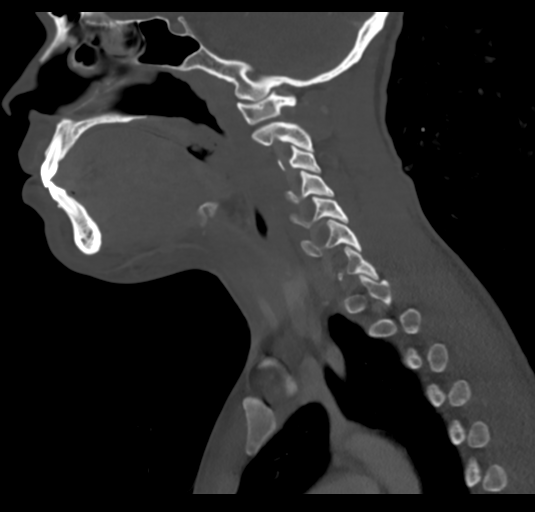
[im 67/101  bone]
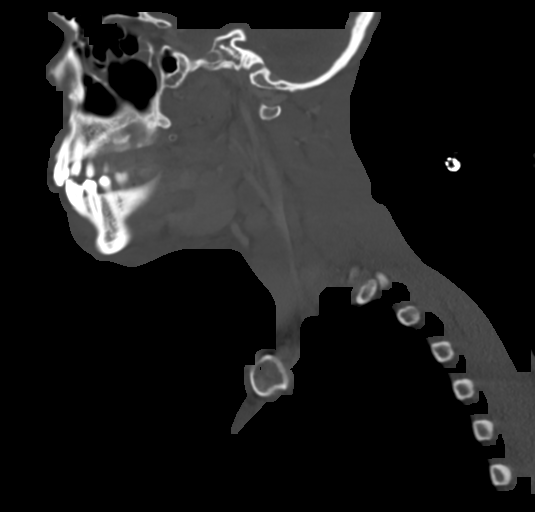

[Series 6: neck 2.0 st · coronal · 0.36mm/px · 3 of 101 slices shown (3 of 3)]
[im 21/101  bone]
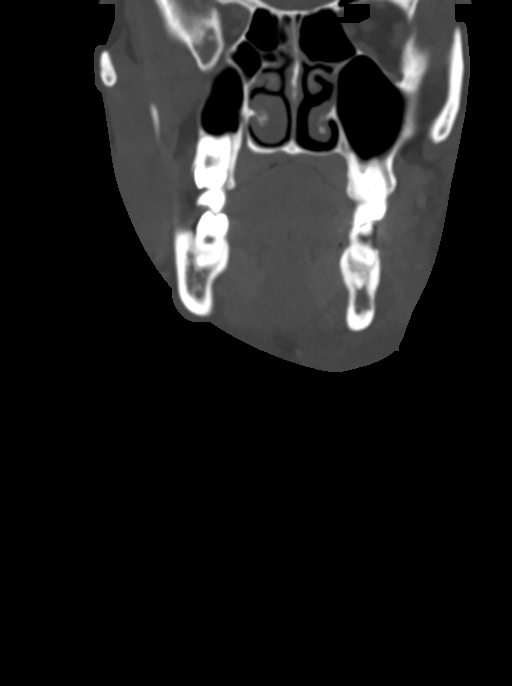
[im 41/101  bone]
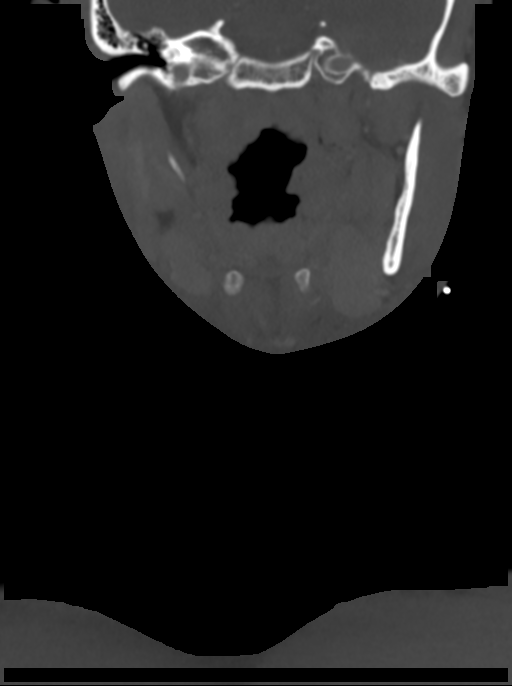
[im 61/101  bone]
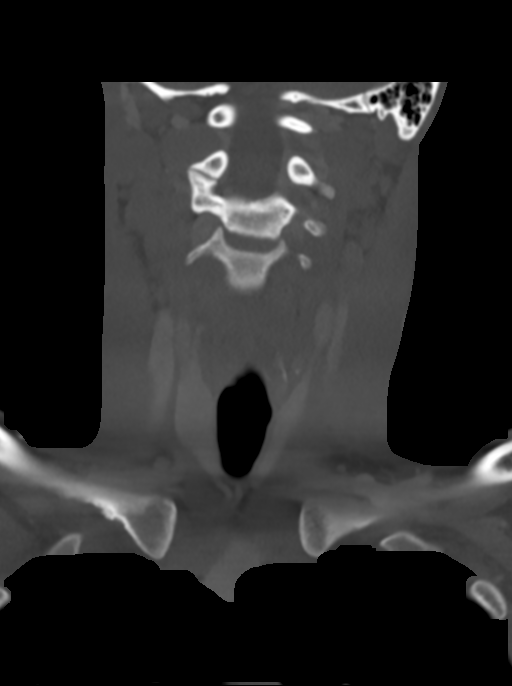

[Series 7: neck 2.0 st orthogonal · axial · 0.39mm/px · z∈[-240,-118]mm · 3 of 123 slices shown]
[im 31/123  bone]
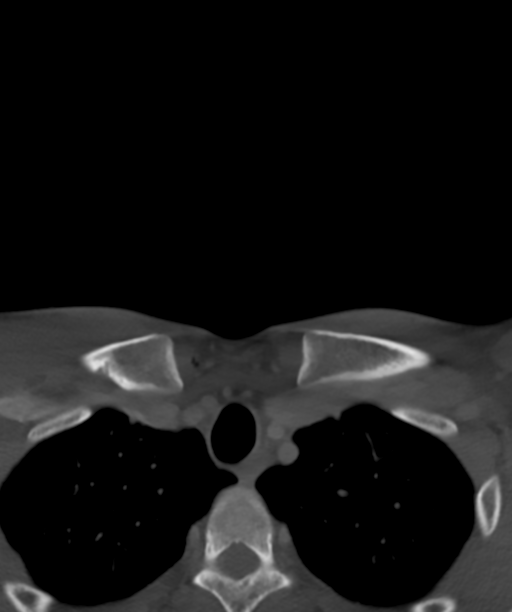
[im 62/123  bone]
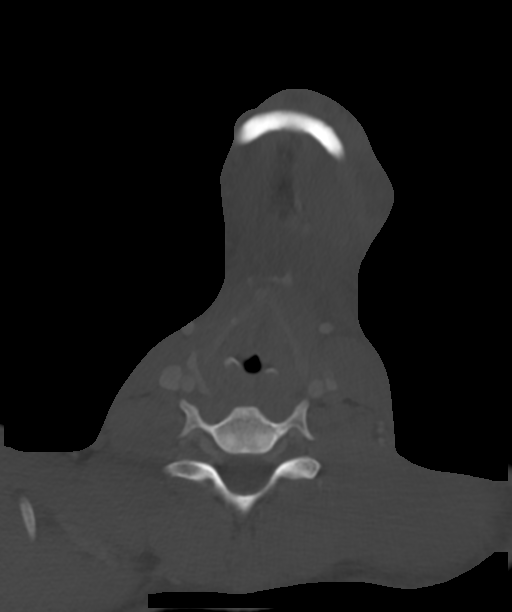
[im 92/123  bone]
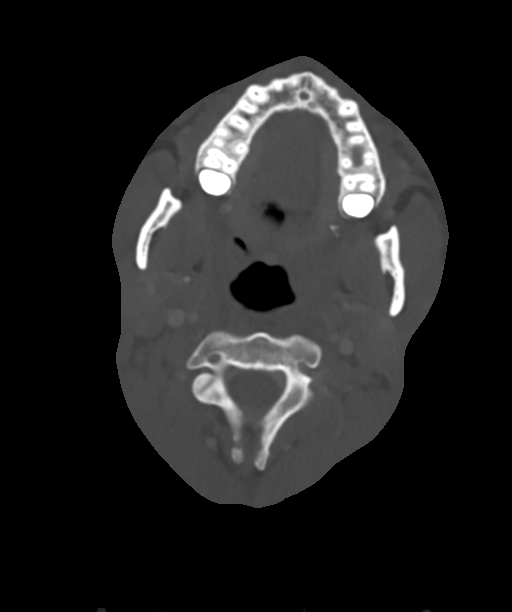

[14 of 33 positions shown; findings below may reference images not displayed]

FINDINGS: Pharynx and larynx: Mild adenoid thickening. No submucosal edema or
airway narrowing.

Salivary glands: No primary inflammation.

Thyroid: Negative

Lymph nodes: Reactive nodal enlargement in the left more than right
neck

Vascular: Major venous structures are patent

Limited intracranial: Negative

Visualized orbits: Negative

Mastoids and visualized paranasal sinuses: Essentially clear

Skeleton: Periapical erosion around tooth number tooth 19 which has
a large cavity and periapical erosion. There is adjacent low-density
collection along the buccal surface measuring up to 13 mm diameter
at the level of the bone. Best seen on coronal reformats this
collection extends into the subcutaneous left submandibular region
where there is a 2.5 cm abscess that has nearly decompressed through
the skin. No clear floor of mouth swelling or mass effect. There is
regional cellulitis.

Upper chest: Partial anomalous pulmonary venous return affecting the
left upper lobe.
IMPRESSION: 1. Odontogenic soft tissue infection related to tooth 19 with
subperiosteal abscess extending into the subcutaneous left
submandibular space where there is a 2.5 cm abscess.
2. Partial anomalous pulmonary venous return affecting the left
upper lobe.

## 2021-05-04 ENCOUNTER — Other Ambulatory Visit: Payer: Self-pay

## 2021-05-04 ENCOUNTER — Emergency Department (HOSPITAL_COMMUNITY)
Admission: EM | Admit: 2021-05-04 | Discharge: 2021-05-04 | Disposition: A | Discharge status: Home / Self Care | Payer: Medicaid Other | Attending: Emergency Medicine | Admitting: Emergency Medicine

## 2021-05-04 ENCOUNTER — Encounter (HOSPITAL_COMMUNITY): Payer: Self-pay

## 2021-05-04 DIAGNOSIS — Z202 Contact with and (suspected) exposure to infections with a predominantly sexual mode of transmission: Secondary | ICD-10-CM | POA: Insufficient documentation

## 2021-05-04 DIAGNOSIS — F17200 Nicotine dependence, unspecified, uncomplicated: Secondary | ICD-10-CM | POA: Insufficient documentation

## 2021-05-04 DIAGNOSIS — Z711 Person with feared health complaint in whom no diagnosis is made: Secondary | ICD-10-CM

## 2021-05-04 LAB — URINALYSIS, ROUTINE W REFLEX MICROSCOPIC
Bilirubin Urine: NEGATIVE
Glucose, UA: NEGATIVE mg/dL
Ketones, ur: NEGATIVE mg/dL
Nitrite: NEGATIVE
Protein, ur: NEGATIVE mg/dL
RBC / HPF: >50 RBC/hpf — ABNORMAL HIGH (ref 0–5)
Specific Gravity, Urine: 1.021 (ref 1.005–1.030)
pH: 5 (ref 5.0–8.0)

## 2021-05-04 MED ORDER — DOXYCYCLINE HYCLATE 100 MG PO CAPS
100.0000 mg | ORAL_CAPSULE | Freq: Two times a day (BID) | ORAL | 0 refills | Status: AC
Start: 2021-05-04 — End: 2021-05-11

## 2021-05-04 MED ORDER — DOXYCYCLINE HYCLATE 100 MG PO TABS
100.0000 mg | ORAL_TABLET | Freq: Once | ORAL | Status: AC
  Administered 2021-05-04: 100 mg via ORAL
  Filled 2021-05-04: qty 1

## 2021-05-04 MED ORDER — CEFTRIAXONE SODIUM 500 MG IJ SOLR
500.0000 mg | Freq: Once | INTRAMUSCULAR | Status: AC
  Administered 2021-05-04: 500 mg via INTRAMUSCULAR
  Filled 2021-05-04: qty 500

## 2021-05-04 NOTE — ED Triage Notes (Signed)
Patient complains of 3 days of penile discharge. denies pain, denies dysuria.

## 2021-05-04 NOTE — Discharge Instructions (Signed)

## 2021-05-04 NOTE — ED Provider Notes (Signed)
[facility] EMERGENCY DEPARTMENT Provider Note   CSN: [id number] Arrival date & time: 05/04/21  9381     History No chief complaint on file.   Ross Butler is a 25 y.o. male who presents for evaluation of penile discharge x3 days.  States that he has not had any dysuria, hematuria, abdominal pain, fevers.  He is currently sexually active with multiple partners.  They do not use protection.  He denies any recent anal intercourse.  The history is provided by the patient.       Past Medical History:  Diagnosis Date  . Medical history non-contributory     Patient Active Problem List   Diagnosis Date Noted  . Contact with and (suspected) exposure to covid-19 07/11/2020  . Dental abscess 02/18/2019  . Tooth abscess 02/18/2019    Past Surgical History:  Procedure Laterality Date  . NO PAST SURGERIES    . TOOTH EXTRACTION N/A 02/18/2019   Procedure: DENTAL EXTRACTION TOOTH 19 AND DRAINAGE OF ABSCESS;  Surgeon: Ocie Doyne, DDS;  Location: [facility] OR;  Service: Oral Surgery;  Laterality: N/A;       No family history on file.  Social History   Tobacco Use  . Smoking status: Current Every Day Smoker  . Smokeless tobacco: Never Used  Substance Use Topics  . Alcohol use: Yes    Comment: occ  . Drug use: Yes    Types: Marijuana    Home Medications Prior to Admission medications   Medication Sig Start Date End Date Taking? Authorizing Provider  doxycycline (VIBRAMYCIN) 100 MG capsule Take 1 capsule (100 mg total) by mouth 2 (two) times daily for 7 days. 05/04/21 05/11/21 Yes Maxwell Caul, PA-C  cyclobenzaprine (FLEXERIL) 5 MG tablet Take 2.5-5 mg by mouth at bedtime as needed. 11/15/19   [provider]  nabumetone (RELAFEN) 500 MG tablet Take 500-1,000 mg by mouth 2 (two) times daily. 12/18/19   [provider]  naproxen (NAPROSYN) 500 MG tablet Take 1 tablet (500 mg total) by mouth 2 (two) times daily. 01/22/20   Khatri, Hina, PA-C   oxyCODONE-acetaminophen (PERCOCET) 5-325 MG tablet Take 1 tablet by mouth every 6 (six) hours as needed for severe pain. Patient not taking: Reported on 03/03/2020 02/19/19   Tyson Alias, MD    Allergies    Patient has no known allergies.  Review of Systems   Review of Systems  Gastrointestinal: Negative for abdominal pain.  Genitourinary: Positive for penile discharge. Negative for dysuria, hematuria, penile swelling, scrotal swelling and testicular pain.  All other systems reviewed and are negative.   Physical Exam Updated Vital Signs BP 139/67 (BP Location: Right Arm)   Pulse 68   Temp 98.2 F (36.8 C)   Resp 16   SpO2 100%   Physical Exam Vitals and nursing note reviewed. Exam conducted with a chaperone present.  Constitutional:      Appearance: He is well-developed.  HENT:     Head: Normocephalic and atraumatic.  Eyes:     General: No scleral icterus.       Right eye: No discharge.        Left eye: No discharge.     Conjunctiva/sclera: Conjunctivae normal.  Pulmonary:     Effort: Pulmonary effort is normal.  Abdominal:     Comments: Abdomen is soft, non-distended, non-tender. No rigidity, No guarding. No peritoneal signs.  Genitourinary:    Penis: Normal and circumcised.      Testes:  Right: Tenderness not present.        Left: Tenderness not present.     Comments: The exam was performed with a chaperone present. Normal male genitalia. No evidence of rash, ulcers or lesions. Bilateral testicles are without any warmth, erythema, or edema.  Skin:    General: Skin is warm and dry.  Neurological:     Mental Status: He is alert.  Psychiatric:        Speech: Speech normal.        Behavior: Behavior normal.     ED Results / Procedures / Treatments   Labs (all labs ordered are listed, but only abnormal results are displayed) Labs Reviewed  URINALYSIS, ROUTINE W REFLEX MICROSCOPIC - Abnormal; Notable for the following components:      Result  Value   APPearance HAZY (*)    Hgb urine dipstick MODERATE (*)    Leukocytes,Ua TRACE (*)    RBC / HPF >50 (*)    Bacteria, UA RARE (*)    All other components within normal limits  GC/CHLAMYDIA PROBE AMP ([facility]) NOT AT [facility]    EKG None  Radiology No results found.  Procedures Procedures   Medications Ordered in ED Medications  cefTRIAXone (ROCEPHIN) injection 500 mg (500 mg Intramuscular Given 05/04/21 1008)  doxycycline (VIBRA-TABS) tablet 100 mg (100 mg Oral Given 05/04/21 1008)    ED Course  I have reviewed the triage vital signs and the nursing notes.  Pertinent labs & imaging results that were available during my care of the patient were reviewed by me and considered in my medical decision making (see chart for details).  Clinical Course as of 05/04/21 1526  Sun May 04, 2021  1002 Urinalysis, Routine w reflex microscopic Urine, Clean Catch [LL]    Clinical Course User Index [LL] Rosana Hoes   MDM Rules/Calculators/A&P                          25 y.o. M who presents for evaluation of 3 days of penile discharge. He is concerned for STD. He has multiple partners and they do not use protection.. Patient is afebrile, non-toxic appearing, sitting comfortably on examination table. Vital signs reviewed and stable. GU exam shows normal testicles, penis. NO active discharge.  No tenderness to palpation of the testes or epididymis to suggest orchitis or epididymitis.  STD cultures obtained and pending. Discussed with patient treatment options including treatment today or waiting until cultures returned for treatment. Patient wishes to have treatment today.  Patient with no known drug allergies.  Discussed importance of using protection when sexually active. Pt understands that they have GC/Chlamydia cultures pending and that they will need to inform all sexual partners if results return positive. Patient has been treated prophylactically. Patient had ample  opportunity for questions and discussion. All patient's questions were answered with full understanding. Strict return precautions discussed. Patient expresses understanding and agreement to plan.   Portions of this note were generated with Scientist, clinical (histocompatibility and immunogenetics). Dictation errors may occur despite best attempts at proofreading.   Final Clinical Impression(s) / ED Diagnoses Final diagnoses:  Concern about STD in male without diagnosis    Rx / DC Orders ED Discharge Orders         Ordered    doxycycline (VIBRAMYCIN) 100 MG capsule  2 times daily        05/04/21 1003           Hollister Wessler,  Early Chars, PA-C 05/04/21 1526    Gerhard Munch, MD 05/04/21 [phone]

## 2021-05-05 LAB — GC/CHLAMYDIA PROBE AMP (~~LOC~~) NOT AT ARMC
Chlamydia: POSITIVE — AB
Comment: NEGATIVE
Comment: NORMAL
Neisseria Gonorrhea: POSITIVE — AB

## 2021-06-19 ENCOUNTER — Encounter (HOSPITAL_COMMUNITY): Payer: Self-pay | Admitting: Emergency Medicine

## 2021-06-19 ENCOUNTER — Other Ambulatory Visit: Payer: Self-pay

## 2021-06-19 ENCOUNTER — Emergency Department (HOSPITAL_COMMUNITY): Payer: Self-pay

## 2021-06-19 ENCOUNTER — Emergency Department (HOSPITAL_COMMUNITY)
Admission: EM | Admit: 2021-06-19 | Discharge: 2021-06-19 | Disposition: A | Discharge status: Home / Self Care | Payer: Self-pay | Attending: Emergency Medicine | Admitting: Emergency Medicine

## 2021-06-19 DIAGNOSIS — Z5321 Procedure and treatment not carried out due to patient leaving prior to being seen by health care provider: Secondary | ICD-10-CM | POA: Insufficient documentation

## 2021-06-19 DIAGNOSIS — N4829 Other inflammatory disorders of penis: Secondary | ICD-10-CM | POA: Insufficient documentation

## 2021-06-19 DIAGNOSIS — M79641 Pain in right hand: Secondary | ICD-10-CM | POA: Insufficient documentation

## 2021-06-19 LAB — CBC WITH DIFFERENTIAL/PLATELET
Abs Immature Granulocytes: 0.01 10*3/uL (ref 0.00–0.07)
Basophils Absolute: 0 10*3/uL (ref 0.0–0.1)
Basophils Relative: 1 %
Eosinophils Absolute: 0.1 10*3/uL (ref 0.0–0.5)
Eosinophils Relative: 2 %
HCT: 41.5 % (ref 39.0–52.0)
Hemoglobin: 15 g/dL (ref 13.0–17.0)
Immature Granulocytes: 0 %
Lymphocytes Relative: 48 %
Lymphs Abs: 3.4 10*3/uL (ref 0.7–4.0)
MCH: 32 pg (ref 26.0–34.0)
MCHC: 36.1 g/dL — ABNORMAL HIGH (ref 30.0–36.0)
MCV: 88.5 fL (ref 80.0–100.0)
Monocytes Absolute: 0.5 10*3/uL (ref 0.1–1.0)
Monocytes Relative: 7 %
Neutro Abs: 2.9 10*3/uL (ref 1.7–7.7)
Neutrophils Relative %: 42 %
Platelets: 194 10*3/uL (ref 150–400)
RBC: 4.69 MIL/uL (ref 4.22–5.81)
RDW: 12.2 % (ref 11.5–15.5)
WBC: 7 10*3/uL (ref 4.0–10.5)
nRBC: 0 % (ref 0.0–0.2)

## 2021-06-19 NOTE — ED Provider Notes (Signed)
Emergency Medicine Provider Triage Evaluation Note  Ross Butler , a 25 y.o. male  was evaluated in triage.  Pt complains of intermittent erection. States that he got an erection while at work yesterday and, after it subsided, he noticed increased prominence to the vein along the right lateral shaft of his penis. Has been having intermittent "swelling" to that side since which waxes/wanes without known modifying factors. No difficulty voiding, hx of trauma, penile discharge, scrotal swelling, testicular tenderness, use of illicit drugs.  Also c/o subjective deformity of his right 3rd MCP joint. Does endorse "punching things", but not for ~1 month. Thinks his bone may be "dislocated", but has no restricted movement of right hand or digits. Is RHD.  Review of Systems  Positive: Penile swelling, R hand pain Negative: Difficulty voiding  Physical Exam  BP (!) 137/92 (BP Location: Right Arm)   Pulse 79   Temp 98 F (36.7 C) (Oral)   Resp 18   SpO2 99%  Gen:   Awake, no distress   Resp:  Normal effort MSK:   Moves extremities without difficulty  Other:  Exam chaperoned with RN. Normal circumcised penis. Normal scrotal examination. No testicular tenderness b/l.  Medical Decision Making  Medically screening exam initiated at 3:24 AM.  Appropriate orders placed.  Ross Butler was informed that the remainder of the evaluation will be completed by another provider, this initial triage assessment does not replace that evaluation, and the importance of remaining in the ED until their evaluation is complete.  Prominent penile vein R hand pain   Antony Madura, PA-C 06/19/21 0329    Glynn Octave, MD 06/19/21 [phone]

## 2021-06-19 NOTE — ED Triage Notes (Signed)
Pt presents to ED POV. Pt c/o spontaneous erection since yesterday. Pt reports that it was painful. Pt denies any GU/GI s/s.

## 2021-06-19 NOTE — ED Notes (Signed)
Pt stated he was leaving. He did not tell this tech that he was leaving, he told one of the [employer] employees and she relayed the message to this tech.

## 2021-08-09 ENCOUNTER — Emergency Department (HOSPITAL_COMMUNITY)
Admission: EM | Admit: 2021-08-09 | Discharge: 2021-08-09 | Disposition: A | Discharge status: Home / Self Care | Payer: Medicaid Other | Attending: Emergency Medicine | Admitting: Emergency Medicine

## 2021-08-09 ENCOUNTER — Other Ambulatory Visit: Payer: Self-pay

## 2021-08-09 ENCOUNTER — Encounter (HOSPITAL_COMMUNITY): Payer: Self-pay

## 2021-08-09 DIAGNOSIS — R112 Nausea with vomiting, unspecified: Secondary | ICD-10-CM | POA: Insufficient documentation

## 2021-08-09 DIAGNOSIS — M791 Myalgia, unspecified site: Secondary | ICD-10-CM | POA: Insufficient documentation

## 2021-08-09 DIAGNOSIS — R531 Weakness: Secondary | ICD-10-CM | POA: Insufficient documentation

## 2021-08-09 DIAGNOSIS — Z5321 Procedure and treatment not carried out due to patient leaving prior to being seen by health care provider: Secondary | ICD-10-CM | POA: Insufficient documentation

## 2021-08-09 LAB — URINALYSIS, ROUTINE W REFLEX MICROSCOPIC
Bilirubin Urine: NEGATIVE
Glucose, UA: NEGATIVE mg/dL
Ketones, ur: 5 mg/dL — AB
Leukocytes,Ua: NEGATIVE
Nitrite: NEGATIVE
Protein, ur: 100 mg/dL — AB
Specific Gravity, Urine: 1.027 (ref 1.005–1.030)
pH: 5 (ref 5.0–8.0)

## 2021-08-09 LAB — CBC
HCT: 49.3 % (ref 39.0–52.0)
Hemoglobin: 17.7 g/dL — ABNORMAL HIGH (ref 13.0–17.0)
MCH: 31.1 pg (ref 26.0–34.0)
MCHC: 35.9 g/dL (ref 30.0–36.0)
MCV: 86.6 fL (ref 80.0–100.0)
Platelets: 205 10*3/uL (ref 150–400)
RBC: 5.69 MIL/uL (ref 4.22–5.81)
RDW: 11.7 % (ref 11.5–15.5)
WBC: 13.2 10*3/uL — ABNORMAL HIGH (ref 4.0–10.5)
nRBC: 0 % (ref 0.0–0.2)

## 2021-08-09 LAB — COMPREHENSIVE METABOLIC PANEL
ALT: 32 U/L (ref 0–44)
AST: 73 U/L — ABNORMAL HIGH (ref 15–41)
Albumin: 5.2 g/dL — ABNORMAL HIGH (ref 3.5–5.0)
Alkaline Phosphatase: 62 U/L (ref 38–126)
Anion gap: 15 (ref 5–15)
BUN: 29 mg/dL — ABNORMAL HIGH (ref 6–20)
CO2: 16 mmol/L — ABNORMAL LOW (ref 22–32)
Calcium: 10.7 mg/dL — ABNORMAL HIGH (ref 8.9–10.3)
Chloride: 100 mmol/L (ref 98–111)
Creatinine, Ser: 2.22 mg/dL — ABNORMAL HIGH (ref 0.61–1.24)
GFR, Estimated: 41 mL/min — ABNORMAL LOW (ref >60)
Glucose, Bld: 130 mg/dL — ABNORMAL HIGH (ref 70–99)
Potassium: 3.7 mmol/L (ref 3.5–5.1)
Sodium: 131 mmol/L — ABNORMAL LOW (ref 135–145)
Total Bilirubin: 1 mg/dL (ref 0.3–1.2)
Total Protein: 8.6 g/dL — ABNORMAL HIGH (ref 6.5–8.1)

## 2021-08-09 LAB — LIPASE, BLOOD: Lipase: 26 U/L (ref 11–51)

## 2021-08-09 NOTE — ED Notes (Signed)
No answer for VS x3 

## 2021-08-09 NOTE — ED Triage Notes (Signed)
Reports cramping all over while at work, went home and took a shower and cramping started back again. Pt also had nausea and vomiting.

## 2021-09-10 ENCOUNTER — Encounter (HOSPITAL_COMMUNITY): Payer: Self-pay | Admitting: Emergency Medicine

## 2021-09-10 ENCOUNTER — Emergency Department (HOSPITAL_COMMUNITY)
Admission: EM | Admit: 2021-09-10 | Discharge: 2021-09-10 | Disposition: A | Discharge status: Home / Self Care | Payer: Medicaid Other | Attending: Emergency Medicine | Admitting: Emergency Medicine

## 2021-09-10 ENCOUNTER — Other Ambulatory Visit: Payer: Self-pay

## 2021-09-10 DIAGNOSIS — Z28310 Unvaccinated for covid-19: Secondary | ICD-10-CM | POA: Insufficient documentation

## 2021-09-10 DIAGNOSIS — F17200 Nicotine dependence, unspecified, uncomplicated: Secondary | ICD-10-CM | POA: Insufficient documentation

## 2021-09-10 DIAGNOSIS — U071 COVID-19: Secondary | ICD-10-CM | POA: Insufficient documentation

## 2021-09-10 MED ORDER — BENZONATATE 100 MG PO CAPS
100.0000 mg | ORAL_CAPSULE | Freq: Three times a day (TID) | ORAL | 0 refills | Status: AC | PRN
Start: 2021-09-10 — End: ?

## 2021-09-10 MED ORDER — ALBUTEROL SULFATE HFA 108 (90 BASE) MCG/ACT IN AERS
2.0000 | INHALATION_SPRAY | Freq: Once | RESPIRATORY_TRACT | Status: AC
  Administered 2021-09-10: 2 via RESPIRATORY_TRACT
  Filled 2021-09-10: qty 6.7

## 2021-09-10 MED ORDER — FLUTICASONE PROPIONATE 50 MCG/ACT NA SUSP: 1.0000 | Freq: Every day | NASAL | 0 refills | Status: AC

## 2021-09-10 NOTE — Discharge Instructions (Signed)
You were seen in the Er today for symptoms we suspect are secondary to covid 19.  Please take tessalon every 8 hours as needed for cough, use flonase 1 spray per nostril daily as needed for congestion, and use the albuterol inhaler 1-2 puffs every 4-6 hours as needed for shortness of breath or wheezing.   We are instructing patient's with COVID 19 or symptoms of COVID 19 to follow the below instructions regardless of vaccination status:  Your first day of symptoms is day 0 - Stay home for days 0-5.  - If you have no symptoms or your symptoms are resolving after 5 days, you can leave your house on day 6--> Continue to wear a mask around others for 5 additional days. - If you have a fever, continue to stay home until your fever resolves.  Please follow up with primary care within 3-5 days for re-evaluation- call prior to going to the office to make them aware of your symptoms as some offices are altering their method of seeing patients with COVID 19 symptoms, we have also provided our [place] covid clinic for follow up as well.  Return to the ER for new or worsening symptoms including but not limited to increased work of breathing, chest pain, passing out, coughing up blood, inability to keep fluids down or any other concerns.       Person Under Monitoring Name: Ross Butler  Location: [address] [place] [address]   Infection Prevention Recommendations for Individuals Confirmed to have, or Being Evaluated for, 2019 Novel Coronavirus (COVID-19) Infection Who Receive Care at Home  Individuals who are confirmed to have, or are being evaluated for, COVID-19 should follow the prevention steps below until a healthcare provider or local or state health department says they can return to normal activities.  Stay home except to get medical care You should restrict activities outside your home, except for getting medical care. Do not go to work, school, or public areas, and do not  use public transportation or taxis.  Call ahead before visiting your doctor Before your medical appointment, call the healthcare provider and tell them that you have, or are being evaluated for, COVID-19 infection. This will help the healthcare provider's office take steps to keep other people from getting infected. Ask your healthcare provider to call the local or state health department.  Monitor your symptoms Seek prompt medical attention if your illness is worsening (e.g., difficulty breathing). Before going to your medical appointment, call the healthcare provider and tell them that you have, or are being evaluated for, COVID-19 infection. Ask your healthcare provider to call the local or state health department.  Wear a facemask You should wear a facemask that covers your nose and mouth when you are in the same room with other people and when you visit a healthcare provider. People who live with or visit you should also wear a facemask while they are in the same room with you.  Separate yourself from other people in your home As much as possible, you should stay in a different room from other people in your home. Also, you should use a separate bathroom, if available.  Avoid sharing household items You should not share dishes, drinking glasses, cups, eating utensils, towels, bedding, or other items with other people in your home. After using these items, you should wash them thoroughly with soap and water.  Cover your coughs and sneezes Cover your mouth and nose with a tissue when you cough or  sneeze, or you can cough or sneeze into your sleeve. Throw used tissues in a lined trash can, and immediately wash your hands with soap and water for at least 20 seconds or use an alcohol-based hand rub.  Wash your [employer] your hands often and thoroughly with soap and water for at least 20 seconds. You can use an alcohol-based hand sanitizer if soap and water are not available and if  your hands are not visibly dirty. Avoid touching your eyes, nose, and mouth with unwashed hands.   Prevention Steps for Caregivers and Household Members of Individuals Confirmed to have, or Being Evaluated for, COVID-19 Infection Being Cared for in the Home  If you live with, or provide care at home for, a person confirmed to have, or being evaluated for, COVID-19 infection please follow these guidelines to prevent infection:  Follow healthcare provider's instructions Make sure that you understand and can help the patient follow any healthcare provider instructions for all care.  Provide for the patient's basic needs You should help the patient with basic needs in the home and provide support for getting groceries, prescriptions, and other personal needs.  Monitor the patient's symptoms If they are getting sicker, call his or her medical provider and tell them that the patient has, or is being evaluated for, COVID-19 infection. This will help the healthcare provider's office take steps to keep other people from getting infected. Ask the healthcare provider to call the local or state health department.  Limit the number of people who have contact with the patient If possible, have only one caregiver for the patient. Other household members should stay in another home or place of residence. If this is not possible, they should stay in another room, or be separated from the patient as much as possible. Use a separate bathroom, if available. Restrict visitors who do not have an essential need to be in the home.  Keep older adults, very young children, and other sick people away from the patient Keep older adults, very young children, and those who have compromised immune systems or chronic health conditions away from the patient. This includes people with chronic heart, lung, or kidney conditions, diabetes, and cancer.  Ensure good ventilation Make sure that shared spaces in the home have  good air flow, such as from an air conditioner or an opened window, weather permitting.  Wash your hands often Wash your hands often and thoroughly with soap and water for at least 20 seconds. You can use an alcohol based hand sanitizer if soap and water are not available and if your hands are not visibly dirty. Avoid touching your eyes, nose, and mouth with unwashed hands. Use disposable paper towels to dry your hands. If not available, use dedicated cloth towels and replace them when they become wet.  Wear a facemask and gloves Wear a disposable facemask at all times in the room and gloves when you touch or have contact with the patient's blood, body fluids, and/or secretions or excretions, such as sweat, saliva, sputum, nasal mucus, vomit, urine, or feces.  Ensure the mask fits over your nose and mouth tightly, and do not touch it during use. Throw out disposable facemasks and gloves after using them. Do not reuse. Wash your hands immediately after removing your facemask and gloves. If your personal clothing becomes contaminated, carefully remove clothing and launder. Wash your hands after handling contaminated clothing. Place all used disposable facemasks, gloves, and other waste in a lined container before disposing  them with other household waste. Remove gloves and wash your hands immediately after handling these items.  Do not share dishes, glasses, or other household items with the patient Avoid sharing household items. You should not share dishes, drinking glasses, cups, eating utensils, towels, bedding, or other items with a patient who is confirmed to have, or being evaluated for, COVID-19 infection. After the person uses these items, you should wash them thoroughly with soap and water.  Wash laundry thoroughly Immediately remove and wash clothes or bedding that have blood, body fluids, and/or secretions or excretions, such as sweat, saliva, sputum, nasal mucus, vomit, urine, or  feces, on them. Wear gloves when handling laundry from the patient. Read and follow directions on labels of laundry or clothing items and detergent. In general, wash and dry with the warmest temperatures recommended on the label.  Clean all areas the individual has used often Clean all touchable surfaces, such as counters, tabletops, doorknobs, bathroom fixtures, toilets, phones, keyboards, tablets, and bedside tables, every day. Also, clean any surfaces that may have blood, body fluids, and/or secretions or excretions on them. Wear gloves when cleaning surfaces the patient has come in contact with. Use a diluted bleach solution (e.g., dilute bleach with 1 part bleach and 10 parts water) or a household disinfectant with a label that says EPA-registered for coronaviruses. To make a bleach solution at home, add 1 tablespoon of bleach to 1 quart (4 cups) of water. For a larger supply, add  cup of bleach to 1 gallon (16 cups) of water. Read labels of cleaning products and follow recommendations provided on product labels. Labels contain instructions for safe and effective use of the cleaning product including precautions you should take when applying the product, such as wearing gloves or eye protection and making sure you have good ventilation during use of the product. Remove gloves and wash hands immediately after cleaning.  Monitor yourself for signs and symptoms of illness Caregivers and household members are considered close contacts, should monitor their health, and will be asked to limit movement outside of the home to the extent possible. Follow the monitoring steps for close contacts listed on the symptom monitoring form.   ? If you have additional questions, contact your local health department or call the epidemiologist on call at [phone] (available 24/7). ? This guidance is subject to change. For the most up-to-date guidance from [facility], please refer to their  website: [email or web address]

## 2021-09-10 NOTE — ED Triage Notes (Signed)
Patient tested positive for Covid19 at [facility] 2 days ago , reports occasional dry cough and mild SOB , denies fever or chills .

## 2021-09-10 NOTE — ED Provider Notes (Signed)
[facility] EMERGENCY DEPARTMENT Provider Note   CSN: [id number] Arrival date & time: 09/10/21  2206     History Chief Complaint  Patient presents with   Covid+ / SOB     Ross Butler is a 25 y.o. male with a hx of tobacco use who presents to the ED for evaluation of not feeling well with positive covid 19 testing. Patient reports he has felt poorly for 2-3 days, reports congestion, mild headaches, productive cough, chills, and episodes of dyspnea. Alleviated some with tylenol. Dyspnea episodes are mild and are without specific triggers. Denies fever, N/V/D, chest pain, hemoptysis, or leg pain/swelling. Patient relays he did not receive covid vaccine. Tested positive for covid with [facility] testing.   HPI     Past Medical History:  Diagnosis Date   Medical history non-contributory     Patient Active Problem List   Diagnosis Date Noted   Contact with and (suspected) exposure to covid-19 07/11/2020   Dental abscess 02/18/2019   Tooth abscess 02/18/2019    Past Surgical History:  Procedure Laterality Date   NO PAST SURGERIES     TOOTH EXTRACTION N/A 02/18/2019   Procedure: DENTAL EXTRACTION TOOTH 19 AND DRAINAGE OF ABSCESS;  Surgeon: Ocie Doyne, DDS;  Location: [facility] OR;  Service: Oral Surgery;  Laterality: N/A;       History reviewed. No pertinent family history.  Social History   Tobacco Use   Smoking status: Every Day   Smokeless tobacco: Never  Substance Use Topics   Alcohol use: Yes    Comment: occ   Drug use: Yes    Types: Marijuana    Home Medications Prior to Admission medications   Medication Sig Start Date End Date Taking? Authorizing Provider  benzonatate (TESSALON) 100 MG capsule Take 1 capsule (100 mg total) by mouth 3 (three) times daily as needed for cough. 09/10/21  Yes Rosser Collington R, PA-C  fluticasone (FLONASE) 50 MCG/ACT nasal spray Place 1 spray into both nostrils daily. 09/10/21  Yes Beyonka Pitney R, PA-C   cyclobenzaprine (FLEXERIL) 5 MG tablet Take 2.5-5 mg by mouth at bedtime as needed. 11/15/19   [provider]  nabumetone (RELAFEN) 500 MG tablet Take 500-1,000 mg by mouth 2 (two) times daily. 12/18/19   [provider]  naproxen (NAPROSYN) 500 MG tablet Take 1 tablet (500 mg total) by mouth 2 (two) times daily. 01/22/20   Khatri, Hina, PA-C  oxyCODONE-acetaminophen (PERCOCET) 5-325 MG tablet Take 1 tablet by mouth every 6 (six) hours as needed for severe pain. Patient not taking: Reported on 03/03/2020 02/19/19   Tyson Alias, MD    Allergies    Patient has no known allergies.  Review of Systems   Review of Systems  Constitutional:  Positive for chills and fatigue. Negative for fever.  Respiratory:  Positive for cough and shortness of breath.   Cardiovascular:  Negative for chest pain and leg swelling.  Gastrointestinal:  Negative for diarrhea, nausea and vomiting.  Genitourinary:  Negative for dysuria.  Neurological:  Negative for syncope.  All other systems reviewed and are negative.  Physical Exam Updated Vital Signs BP 133/79 (BP Location: Right Arm)   Pulse 78   Temp 98.8 F (37.1 C) (Oral)   Resp 18   SpO2 100%   Physical Exam Vitals and nursing note reviewed.  Constitutional:      General: He is not in acute distress.    Appearance: He is well-developed. He is not toxic-appearing.  HENT:  Head: Normocephalic and atraumatic.     Right Ear: Ear canal normal. Tympanic membrane is not perforated, erythematous, retracted or bulging.     Left Ear: Ear canal normal. Tympanic membrane is not perforated, erythematous, retracted or bulging.     Ears:     Comments: No mastoid erythema/swellng/tenderness.     Nose: Congestion present.     Right Sinus: No maxillary sinus tenderness or frontal sinus tenderness.     Left Sinus: No maxillary sinus tenderness or frontal sinus tenderness.     Mouth/Throat:     Pharynx: Oropharynx is clear. Uvula midline.  No oropharyngeal exudate or posterior oropharyngeal erythema.     Comments: Posterior oropharynx is symmetric appearing. Patient tolerating own secretions without difficulty. No trismus. No drooling. No hot potato voice. No swelling beneath the tongue, submandibular compartment is soft.  Eyes:     General:        Right eye: No discharge.        Left eye: No discharge.     Conjunctiva/sclera: Conjunctivae normal.  Cardiovascular:     Rate and Rhythm: Normal rate and regular rhythm.  Pulmonary:     Effort: Pulmonary effort is normal. No respiratory distress.     Breath sounds: Normal breath sounds. No wheezing, rhonchi or rales.  Abdominal:     General: There is no distension.     Palpations: Abdomen is soft.     Tenderness: There is no abdominal tenderness.  Musculoskeletal:     Cervical back: Neck supple. No rigidity.  Lymphadenopathy:     Cervical: No cervical adenopathy.  Skin:    General: Skin is warm and dry.     Findings: No rash.  Neurological:     Mental Status: He is alert.  Psychiatric:        Behavior: Behavior normal.    ED Results / Procedures / Treatments   Labs (all labs ordered are listed, but only abnormal results are displayed) Labs Reviewed - No data to display  EKG None  Radiology No results found.  Procedures Procedures   Medications Ordered in ED Medications  albuterol (VENTOLIN HFA) 108 (90 Base) MCG/ACT inhaler 2 puff (2 puffs Inhalation Given 09/10/21 2306)    ED Course  I have reviewed the triage vital signs and the nursing notes.  Pertinent labs & imaging results that were available during my care of the patient were reviewed by me and considered in my medical decision making (see chart for details).    MDM Rules/Calculators/A&P                           Patient presents to the ED with complaints of URI sxs with mild dyspnea intermittently in the setting of positive covid 19 testing. Nontoxic, vitals WNL.   Additional history  obtained:  Additional history obtained from chart review & nursing note review.   ED Course:  Exam is without signs of AOM, AOE, or mastoiditis. Oropharyngeal exam is benign. No sinus tenderness. No meningeal signs. Lungs are CTA without focal adventitious sounds, no signs of increased work of breathing, doubt CAP requiring abx. No wheezing. Ambulatory SpO2 100% on RA without respiratory distress. PERC negative- doubt PE.Suspect symptomatic from covid 19, reassuring exam in the ED, discussed risks/benefits of antiviral therapy given patient unvaccinated- he declines, elects for supportive care which will be provided. I discussed treatment plan, need for follow-up, and return precautions with the patient. Provided opportunity for questions, patient  confirmed understanding and is in agreement with plan.   Portions of this note were generated with Scientist, clinical (histocompatibility and immunogenetics). Dictation errors may occur despite best attempts at proofreading.  Final Clinical Impression(s) / ED Diagnoses Final diagnoses:  COVID-19    Rx / DC Orders ED Discharge Orders          Ordered    benzonatate (TESSALON) 100 MG capsule  3 times daily PRN        09/10/21 2304    fluticasone (FLONASE) 50 MCG/ACT nasal spray  Daily        09/10/21 2304             Cece Milhouse, Pleas Koch, PA-C 09/10/21 2341    Glynn Octave, MD 09/11/21 0111

## 2021-12-20 ENCOUNTER — Emergency Department (HOSPITAL_COMMUNITY)
Admission: EM | Admit: 2021-12-20 | Discharge: 2021-12-21 | Disposition: A | Discharge status: Home / Self Care | Payer: Medicaid Other | Attending: Student | Admitting: Student

## 2021-12-20 ENCOUNTER — Other Ambulatory Visit: Payer: Self-pay

## 2021-12-20 DIAGNOSIS — Z202 Contact with and (suspected) exposure to infections with a predominantly sexual mode of transmission: Secondary | ICD-10-CM | POA: Insufficient documentation

## 2021-12-21 ENCOUNTER — Other Ambulatory Visit: Payer: Self-pay

## 2021-12-21 LAB — URINALYSIS, ROUTINE W REFLEX MICROSCOPIC
Bilirubin Urine: NEGATIVE
Glucose, UA: NEGATIVE mg/dL
Hgb urine dipstick: NEGATIVE
Ketones, ur: NEGATIVE mg/dL
Leukocytes,Ua: NEGATIVE
Nitrite: NEGATIVE
Protein, ur: NEGATIVE mg/dL
Specific Gravity, Urine: 1.02 (ref 1.005–1.030)
pH: 7 (ref 5.0–8.0)

## 2021-12-21 MED ORDER — METRONIDAZOLE 500 MG PO TABS
2000.0000 mg | ORAL_TABLET | Freq: Once | ORAL | Status: AC
  Administered 2021-12-21: 2000 mg via ORAL
  Filled 2021-12-21: qty 4

## 2021-12-21 NOTE — ED Provider Notes (Signed)
[place] EMERGENCY DEPARTMENT Provider Note   CSN: [id number] Arrival date & time: 12/20/21  2351     History  No chief complaint on file.   Ross Butler is a 26 y.o. male presenting today due to exposure to trichomonas.  Patient was sexually active with a male who reported testing positive for this.  He denies any symptoms, no dysuria, penile discharge, hematuria or pubic discomfort.  No concern for other STDs, would just like to be treated for trichomonas.   Home Medications Prior to Admission medications   Medication Sig Start Date End Date Taking? Authorizing Provider  benzonatate (TESSALON) 100 MG capsule Take 1 capsule (100 mg total) by mouth 3 (three) times daily as needed for cough. 09/10/21   Petrucelli, Samantha R, PA-C  cyclobenzaprine (FLEXERIL) 5 MG tablet Take 2.5-5 mg by mouth at bedtime as needed. 11/15/19   [provider]  fluticasone (FLONASE) 50 MCG/ACT nasal spray Place 1 spray into both nostrils daily. 09/10/21   Petrucelli, Samantha R, PA-C  nabumetone (RELAFEN) 500 MG tablet Take 500-1,000 mg by mouth 2 (two) times daily. 12/18/19   [provider]  naproxen (NAPROSYN) 500 MG tablet Take 1 tablet (500 mg total) by mouth 2 (two) times daily. 01/22/20   Khatri, Hina, PA-C  oxyCODONE-acetaminophen (PERCOCET) 5-325 MG tablet Take 1 tablet by mouth every 6 (six) hours as needed for severe pain. Patient not taking: Reported on 03/03/2020 02/19/19   Axel Filler, MD      Allergies    Patient has no known allergies.    Review of Systems   Review of Systems  Physical Exam Updated Vital Signs BP 121/61 (BP Location: Left Arm)    Pulse 65    Temp 98.3 F (36.8 C)    Resp 16    SpO2 100%  Physical Exam Vitals and nursing note reviewed.  Constitutional:      Appearance: Normal appearance.  HENT:     Head: Normocephalic and atraumatic.  Eyes:     General: No scleral icterus.    Conjunctiva/sclera: Conjunctivae  normal.  Pulmonary:     Effort: Pulmonary effort is normal. No respiratory distress.  Genitourinary:    Comments: Deferred in triage Skin:    Findings: No rash.  Neurological:     Mental Status: He is alert.  Psychiatric:        Mood and Affect: Mood normal.    ED Results / Procedures / Treatments   Labs (all labs ordered are listed, but only abnormal results are displayed) Labs Reviewed  URINALYSIS, ROUTINE W REFLEX MICROSCOPIC  GC/CHLAMYDIA PROBE AMP ([place]) NOT AT [facility]    EKG None  Radiology No results found.  Procedures Procedures    Medications Ordered in ED Medications  metroNIDAZOLE (FLAGYL) tablet 2,000 mg (has no administration in time range)    ED Course/ Medical Decision Making/ A&P                           Medical Decision Making  Healthy 26 year old presenting for treatment for trichomonas.  He was recently exposed to it via vaginal intercourse.  Denies offer for empiric treatment of chlamydia and gonorrhea.  Has been given 2 g of Flagyl and instructed not to drink alcohol for the next 24 hours.  He will follow-up with his chlamydia and gonorrhea results via his chart.  Discharge from triage.   Final Clinical Impression(s) / ED Diagnoses Final diagnoses:  STD exposure    Rx / DC Orders Results and diagnoses were explained to the patient. Return precautions discussed in full. Patient had no additional questions and expressed complete understanding.   This chart was dictated using voice recognition software.  Despite best efforts to proofread,  errors can occur which can change the documentation meaning.    Darliss Ridgel 12/21/21 2111    Drenda Freeze, MD 12/21/21 [phone]

## 2021-12-21 NOTE — Discharge Instructions (Signed)
You have been treated for trichomonas today. You will see the remainder of your testing in your chart.

## 2021-12-21 NOTE — ED Triage Notes (Signed)
Pt reported to ED for known exposure to trichomoniasis. Pt denies any symptoms or pain at this time. Denies contact testing positive for other STDs.

## 2022-05-14 ENCOUNTER — Encounter (HOSPITAL_COMMUNITY): Payer: Self-pay

## 2022-05-14 ENCOUNTER — Other Ambulatory Visit: Payer: Self-pay

## 2022-05-14 ENCOUNTER — Emergency Department (HOSPITAL_COMMUNITY)
Admission: EM | Admit: 2022-05-14 | Discharge: 2022-05-14 | Disposition: A | Discharge status: Home / Self Care | Payer: Medicaid Other | Attending: Emergency Medicine | Admitting: Emergency Medicine

## 2022-05-14 DIAGNOSIS — L02211 Cutaneous abscess of abdominal wall: Secondary | ICD-10-CM | POA: Insufficient documentation

## 2022-05-14 DIAGNOSIS — Z711 Person with feared health complaint in whom no diagnosis is made: Secondary | ICD-10-CM

## 2022-05-14 DIAGNOSIS — L0291 Cutaneous abscess, unspecified: Secondary | ICD-10-CM

## 2022-05-14 DIAGNOSIS — Z202 Contact with and (suspected) exposure to infections with a predominantly sexual mode of transmission: Secondary | ICD-10-CM | POA: Insufficient documentation

## 2022-05-14 LAB — GC/CHLAMYDIA PROBE AMP (~~LOC~~) NOT AT ARMC
Chlamydia: NEGATIVE
Comment: NEGATIVE
Comment: NORMAL
Neisseria Gonorrhea: NEGATIVE

## 2022-05-14 MED ORDER — DOXYCYCLINE HYCLATE 100 MG PO CAPS: 100.0000 mg | ORAL_CAPSULE | Freq: Two times a day (BID) | ORAL | 0 refills | Status: AC

## 2022-05-14 MED ORDER — DOXYCYCLINE HYCLATE 100 MG PO TABS
100.0000 mg | ORAL_TABLET | Freq: Once | ORAL | Status: AC
  Administered 2022-05-14: 100 mg via ORAL
  Filled 2022-05-14: qty 1

## 2022-05-14 MED ORDER — LIDOCAINE-EPINEPHRINE (PF) 2 %-1:200000 IJ SOLN
10.0000 mL | Freq: Once | INTRAMUSCULAR | Status: AC
  Administered 2022-05-14: 10 mL
  Filled 2022-05-14: qty 20

## 2022-05-14 NOTE — ED Triage Notes (Signed)
Pt reports with multiple complaints. Pt states that he wants to make sure that he does not have an STD (no symptoms). Pt also wants to have an ingrown hair on his abdomen checked out and wants [place] to drain a bump that is on his left leg.

## 2022-05-14 NOTE — Discharge Instructions (Signed)
You were seen in the emergency department for a skin abscess- please see the attached handout for further information regarding this diagnoses. This area was incised and drained to help release the bacteria. We would like you to apply warm compresses and warm flushes to this area 4-5 times per day to help facilitate further draining as needed. We are also starting you on doxycycline, an antibiotic, in order to help treat the infection.   We have prescribed you new medication(s) today. Discuss the medications prescribed today with your pharmacist as they can have adverse effects and interactions with your other medicines including over the counter and prescribed medications. Seek medical evaluation if you start to experience new or abnormal symptoms after taking one of these medicines, seek care immediately if you start to experience difficulty breathing, feeling of your throat closing, facial swelling, or rash as these could be indications of a more serious allergic reaction  Follow up with primary care for recheck within 3 days. Return to the ER sooner for new or worsening symptoms including, but not limited to increased pain, spreading redness, fevers, inability to keep fluids down, or any other concerns that you may have.  We will call you if your STD test is positive. Please see the health department for general STD needs.

## 2022-05-14 NOTE — ED Provider Notes (Signed)
[facility]-EMERGENCY DEPT Provider Note   CSN: [id number] Arrival date & time: 05/14/22  0024     History  Chief Complaint  Patient presents with   Exposure to STD    Ross Butler is a 26 y.o. male who presents to the ED with multiple complaints.   Reports concern for infection/ingrown hair to the lower abdomen x 3 weeks, area is painful, no alleviating factors. Reports black head to the inner thigh for a few weeks that seems to be improving. Denies fever, chills, nausea, vomiting or abdominal pain. Requesting asymptomatic STD testing. Denies dysuria, penile discharge, frequency, urgency, or testicular pain/swelling.   HPI     Home Medications Prior to Admission medications   Medication Sig Start Date End Date Taking? Authorizing Provider  benzonatate (TESSALON) 100 MG capsule Take 1 capsule (100 mg total) by mouth 3 (three) times daily as needed for cough. 09/10/21   Corbitt Cloke R, PA-C  cyclobenzaprine (FLEXERIL) 5 MG tablet Take 2.5-5 mg by mouth at bedtime as needed. 11/15/19   [provider]  fluticasone (FLONASE) 50 MCG/ACT nasal spray Place 1 spray into both nostrils daily. 09/10/21   Yogi Arther R, PA-C  nabumetone (RELAFEN) 500 MG tablet Take 500-1,000 mg by mouth 2 (two) times daily. 12/18/19   [provider]  naproxen (NAPROSYN) 500 MG tablet Take 1 tablet (500 mg total) by mouth 2 (two) times daily. 01/22/20   Khatri, Hina, PA-C  oxyCODONE-acetaminophen (PERCOCET) 5-325 MG tablet Take 1 tablet by mouth every 6 (six) hours as needed for severe pain. Patient not taking: Reported on 03/03/2020 02/19/19   Tyson Alias, MD      Allergies    Patient has no known allergies.    Review of Systems   Review of Systems  Constitutional:  Negative for chills and fever.  Respiratory:  Negative for shortness of breath.   Cardiovascular:  Negative for chest pain.  Gastrointestinal:  Negative for abdominal pain,  nausea and vomiting.  Genitourinary:  Negative for dysuria, frequency, scrotal swelling and testicular pain.  Skin:  Positive for color change and wound.  All other systems reviewed and are negative.  Physical Exam Updated Vital Signs BP 121/61   Pulse 60   Temp 97.8 F (36.6 C) (Oral)   Resp 14   SpO2 100%  Physical Exam Vitals and nursing note reviewed.  Constitutional:      General: He is not in acute distress.    Appearance: He is well-developed. He is not toxic-appearing.  HENT:     Head: Normocephalic and atraumatic.  Eyes:     General:        Right eye: No discharge.        Left eye: No discharge.     Conjunctiva/sclera: Conjunctivae normal.  Cardiovascular:     Rate and Rhythm: Normal rate and regular rhythm.  Pulmonary:     Effort: No respiratory distress.     Breath sounds: Normal breath sounds. No wheezing or rales.  Abdominal:     General: There is no distension.     Palpations: Abdomen is soft.     Tenderness: There is no abdominal tenderness.  Musculoskeletal:     Cervical back: Neck supple.  Skin:    General: Skin is warm and dry.     Comments: Patient has an area of erythema and induration to the lower abdomen to the superior suprapubic region with central mild fluctuance.  This area of induration/erythema is approximately 3  to 4 cm in diameter.  No purulent drainage.  Patient also has an area of induration that is approximately 1.5 cm to the left distal inner thigh.  No overlying erythema, fluctuance, or drainage.  Neurological:     Mental Status: He is alert.     Comments: Clear speech.   Psychiatric:        Behavior: Behavior normal.    ED Results / Procedures / Treatments   Labs (all labs ordered are listed, but only abnormal results are displayed) Labs Reviewed  GC/CHLAMYDIA PROBE AMP ([place]) NOT AT [facility]    EKG None  Radiology No results found.  Procedures .Marland KitchenIncision and Drainage  Date/Time: 05/14/2022 4:00 AM Performed by:  Cherly Anderson, PA-C Authorized by: Cherly Anderson, PA-C   Consent:    Consent obtained:  Verbal   Consent given by:  Patient   Risks, benefits, and alternatives were discussed: yes     Risks discussed:  Bleeding, damage to other organs, infection, incomplete drainage and pain   Alternatives discussed:  Alternative treatment Location:    Type:  Abscess   Location:  Trunk   Trunk location:  Abdomen Pre-procedure details:    Skin preparation:  Chlorhexidine Anesthesia:    Anesthesia method:  Local infiltration   Local anesthetic:  Lidocaine 2% WITH epi Procedure type:    Complexity:  Simple Procedure details:    Ultrasound guidance: yes     Incision types:  Stab incision   Wound management:  Probed and deloculated and irrigated with saline   Drainage:  Bloody and purulent   Drainage amount:  Moderate   Packing materials:  None Post-procedure details:    Procedure completion:  Tolerated well, no immediate complications    Medications Ordered in ED Medications  lidocaine-EPINEPHrine (XYLOCAINE W/EPI) 2 %-1:200000 (PF) injection 10 mL (has no administration in time range)    ED Course/ Medical Decision Making/ A&P                           Medical Decision Making Risk Prescription drug management.   Patient presents to the ED with complaints of skin infection and requesting STD check. Nontoxic, vitals WNL.   Lower abdominal skin exam w/ findings of abscess/cellulitis, abscess confirmed on bedside US. Procedure per note above. Abscess cavity did not seem large enough to warrant packing/drain placement. RLE [place] without findings of abscess. Will start patient on Doxycycline. Recommended application of warm compresses/soaks/flushing. GC/chlamydia testing obtained from Urine. No complaints to indicate orchitis, epididymitis, or prostatitis. Discussed health dept for STD testing needs. PCP general follow up.   I discussed treatment plan, need for follow-up, and  return precautions with the patient. Provided opportunity for questions, patient confirmed understanding and is in agreement with plan.    Final Clinical Impression(s) / ED Diagnoses Final diagnoses:  Abscess  Concern about STD in male without diagnosis    Rx / DC Orders ED Discharge Orders          Ordered    doxycycline (VIBRAMYCIN) 100 MG capsule  2 times daily        05/14/22 0420              Delayza Lungren, Pleas Koch, PA-C 05/14/22 0433    Palumbo, April, MD 05/14/22 [phone]

## 2022-05-14 NOTE — ED Notes (Signed)
ID at bed side.

## 2022-06-27 ENCOUNTER — Emergency Department (HOSPITAL_COMMUNITY)
Admission: EM | Admit: 2022-06-27 | Discharge: 2022-06-28 | Discharge status: Against Medical Advice | Payer: Medicaid Other | Attending: Emergency Medicine | Admitting: Emergency Medicine

## 2022-06-27 ENCOUNTER — Encounter (HOSPITAL_COMMUNITY): Payer: Self-pay

## 2022-06-27 DIAGNOSIS — Z202 Contact with and (suspected) exposure to infections with a predominantly sexual mode of transmission: Secondary | ICD-10-CM | POA: Insufficient documentation

## 2022-06-27 DIAGNOSIS — Z5321 Procedure and treatment not carried out due to patient leaving prior to being seen by health care provider: Secondary | ICD-10-CM | POA: Insufficient documentation

## 2022-06-27 LAB — URINALYSIS, ROUTINE W REFLEX MICROSCOPIC
Bacteria, UA: NONE SEEN
Bilirubin Urine: NEGATIVE
Glucose, UA: NEGATIVE mg/dL
Hgb urine dipstick: NEGATIVE
Ketones, ur: NEGATIVE mg/dL
Nitrite: NEGATIVE
Protein, ur: 30 mg/dL — AB
Specific Gravity, Urine: 1.033 — ABNORMAL HIGH (ref 1.005–1.030)
WBC, UA: >50 WBC/hpf — ABNORMAL HIGH (ref 0–5)
pH: 5 (ref 5.0–8.0)

## 2022-06-27 NOTE — ED Triage Notes (Signed)
Pt c/o STD symptoms , burning with urination and white discharge x 2 days

## 2022-06-28 NOTE — ED Notes (Signed)
PT called multiple times for vitals with no response

## 2022-06-29 ENCOUNTER — Encounter (HOSPITAL_COMMUNITY): Payer: Self-pay | Admitting: *Deleted

## 2022-06-29 ENCOUNTER — Emergency Department (HOSPITAL_COMMUNITY)
Admission: EM | Admit: 2022-06-29 | Discharge: 2022-06-30 | Disposition: A | Discharge status: Home / Self Care | Payer: Medicaid Other | Attending: Emergency Medicine | Admitting: Emergency Medicine

## 2022-06-29 DIAGNOSIS — R300 Dysuria: Secondary | ICD-10-CM | POA: Insufficient documentation

## 2022-06-29 DIAGNOSIS — Z5321 Procedure and treatment not carried out due to patient leaving prior to being seen by health care provider: Secondary | ICD-10-CM | POA: Insufficient documentation

## 2022-06-29 NOTE — ED Triage Notes (Signed)
Pt with pain with urination and white discharge for 2 days. Pt was seen here last night and LWBS.

## 2022-06-29 NOTE — ED Notes (Signed)
Pt stated "is there a way for nursing staff can bring shot out to the [facility] so pt can inject himself" this NT stated "no, staff cannot bring shots out so pt can administer medication to themselves". Pt frustrated and witness leaving due to pt stating will miss bus back home.

## 2022-06-30 ENCOUNTER — Encounter (HOSPITAL_COMMUNITY): Payer: Self-pay | Admitting: Emergency Medicine

## 2022-06-30 ENCOUNTER — Emergency Department (HOSPITAL_COMMUNITY)
Admission: EM | Admit: 2022-06-30 | Discharge: 2022-06-30 | Disposition: A | Discharge status: Home / Self Care | Payer: Medicaid Other | Attending: Emergency Medicine | Admitting: Emergency Medicine

## 2022-06-30 DIAGNOSIS — R369 Urethral discharge, unspecified: Secondary | ICD-10-CM | POA: Insufficient documentation

## 2022-06-30 DIAGNOSIS — Z711 Person with feared health complaint in whom no diagnosis is made: Secondary | ICD-10-CM | POA: Insufficient documentation

## 2022-06-30 LAB — URINALYSIS, ROUTINE W REFLEX MICROSCOPIC
Bacteria, UA: NONE SEEN
Glucose, UA: NEGATIVE mg/dL
Hgb urine dipstick: NEGATIVE
Ketones, ur: 5 mg/dL — AB
Nitrite: NEGATIVE
Protein, ur: 30 mg/dL — AB
Specific Gravity, Urine: 1.039 — ABNORMAL HIGH (ref 1.005–1.030)
WBC, UA: >50 WBC/hpf — ABNORMAL HIGH (ref 0–5)
pH: 5 (ref 5.0–8.0)

## 2022-06-30 LAB — HIV ANTIBODY (ROUTINE TESTING W REFLEX): HIV Screen 4th Generation wRfx: NONREACTIVE

## 2022-06-30 MED ORDER — AZITHROMYCIN 250 MG PO TABS
1000.0000 mg | ORAL_TABLET | Freq: Once | ORAL | Status: AC
  Administered 2022-06-30: 1000 mg via ORAL
  Filled 2022-06-30: qty 4

## 2022-06-30 MED ORDER — STERILE WATER FOR INJECTION IJ SOLN
INTRAMUSCULAR | Status: AC
  Filled 2022-06-30: qty 10

## 2022-06-30 MED ORDER — CEFTRIAXONE SODIUM 1 G IJ SOLR
500.0000 mg | Freq: Once | INTRAMUSCULAR | Status: AC
  Administered 2022-06-30: 500 mg via INTRAMUSCULAR
  Filled 2022-06-30: qty 10

## 2022-06-30 NOTE — ED Provider Notes (Signed)
[facility]-EMERGENCY DEPT Provider Note   CSN: [id number] Arrival date & time: 06/30/22  1556     History  Chief Complaint  Patient presents with   SEXUALLY TRANSMITTED DISEASE    Ross Butler is a 26 y.o. male with medical history significant for STD here for evaluation of STD testing and treatment.  Sexually active occasionally uses protection.  Has noted some dysuria as well as some penile discharge.  No rashes or lesions.  No pain with bowel movements.  No fever.  Feels similar to his prior STDs, gonorrhea and chlamydia  HPI     Home Medications Prior to Admission medications   Medication Sig Start Date End Date Taking? Authorizing Provider  benzonatate (TESSALON) 100 MG capsule Take 1 capsule (100 mg total) by mouth 3 (three) times daily as needed for cough. Patient not taking: Reported on 06/29/2022 09/10/21   Petrucelli, Pleas Koch, PA-C  doxycycline (VIBRAMYCIN) 100 MG capsule Take 1 capsule (100 mg total) by mouth 2 (two) times daily. Patient not taking: Reported on 06/29/2022 05/14/22   Petrucelli, Lelon Mast R, PA-C  fluticasone (FLONASE) 50 MCG/ACT nasal spray Place 1 spray into both nostrils daily. Patient not taking: Reported on 06/29/2022 09/10/21   Petrucelli, Lelon Mast R, PA-C  naproxen (NAPROSYN) 500 MG tablet Take 1 tablet (500 mg total) by mouth 2 (two) times daily. Patient not taking: Reported on 06/29/2022 01/22/20   Dietrich Pates, PA-C  oxyCODONE-acetaminophen (PERCOCET) 5-325 MG tablet Take 1 tablet by mouth every 6 (six) hours as needed for severe pain. Patient not taking: Reported on 03/03/2020 02/19/19   Tyson Alias, MD      Allergies    Patient has no known allergies.    Review of Systems   Review of Systems  Constitutional: Negative.   HENT: Negative.    Respiratory: Negative.    Cardiovascular: Negative.   Gastrointestinal: Negative.   Genitourinary:  Positive for dysuria and penile discharge. Negative for decreased  urine volume, difficulty urinating, flank pain, frequency, genital sores, hematuria, penile pain, penile swelling, scrotal swelling, testicular pain and urgency.  Musculoskeletal: Negative.   Neurological: Negative.   All other systems reviewed and are negative.   Physical Exam Updated Vital Signs BP 124/74   Pulse 70   Temp 98.7 F (37.1 C) (Oral)   Resp 16   SpO2 100%  Physical Exam Vitals and nursing note reviewed.  Constitutional:      General: He is not in acute distress.    Appearance: He is well-developed. He is not ill-appearing, toxic-appearing or diaphoretic.  HENT:     Head: Normocephalic and atraumatic.  Eyes:     Pupils: Pupils are equal, round, and reactive to light.  Cardiovascular:     Rate and Rhythm: Normal rate and regular rhythm.  Pulmonary:     Effort: Pulmonary effort is normal. No respiratory distress.  Abdominal:     General: Bowel sounds are normal. There is no distension.     Palpations: Abdomen is soft.  Genitourinary:    Comments: Declined Musculoskeletal:        General: Normal range of motion.     Cervical back: Normal range of motion and neck supple.  Skin:    General: Skin is warm and dry.  Neurological:     General: No focal deficit present.     Mental Status: He is alert and oriented to person, place, and time.     ED Results / Procedures / Treatments   Labs (  all labs ordered are listed, but only abnormal results are displayed) Labs Reviewed  URINALYSIS, ROUTINE W REFLEX MICROSCOPIC - Abnormal; Notable for the following components:      Result Value   APPearance HAZY (*)    Specific Gravity, Urine 1.039 (*)    Bilirubin Urine SMALL (*)    Ketones, ur 5 (*)    Protein, ur 30 (*)    Leukocytes,Ua LARGE (*)    WBC, UA >50 (*)    All other components within normal limits  RPR  HIV ANTIBODY (ROUTINE TESTING W REFLEX)  GC/CHLAMYDIA PROBE AMP ([place]) NOT AT [facility]    EKG None  Radiology No results  found.  Procedures Procedures    Medications Ordered in ED Medications  cefTRIAXone (ROCEPHIN) injection 500 mg (500 mg Intramuscular Given 06/30/22 2014)  azithromycin (ZITHROMAX) tablet 1,000 mg (1,000 mg Oral Given 06/30/22 2014)  sterile water (preservative free) injection (  Given 06/30/22 2021)    ED Course/ Medical Decision Making/ A&P     26 year old here for evaluation of dysuria and penile discharge which he feels is consistent with his prior STDs.    Patient is afebrile without abdominal tenderness, abdominal pain or painful bowel movements to indicate prostatitis.  Denies pain to testes or epididymis to suggest orchitis or epididymitis.  STD cultures obtained including HIV, syphilis, gonorrhea and chlamydia. Patient to be discharged with instructions to follow up with PCP. Discussed importance of using protection when sexually active. Pt understands that they have GC/Chlamydia cultures pending and that they will need to inform all sexual partners if results return positive. Patient has been treated prophylactically with azithromycin and Rocephin.  Did not want GU exam today as he states this feels similar to his prior STDs.  The patient has been appropriately medically screened and/or stabilized in the ED. I have low suspicion for any other emergent medical condition which would require further screening, evaluation or treatment in the ED or require inpatient management.  Patient is hemodynamically stable and in no acute distress.  Patient able to ambulate in department prior to ED.  Evaluation does not show acute pathology that would require ongoing or additional emergent interventions while in the emergency department or further inpatient treatment.  I have discussed the diagnosis with the patient and answered all questions.  Pain is been managed while in the emergency department and patient has no further complaints prior to discharge.  Patient is comfortable with plan discussed in  room and is stable for discharge at this time.  I have discussed strict return precautions for returning to the emergency department.  Patient was encouraged to follow-up with PCP/specialist refer to at discharge.                            Medical Decision Making Amount and/or Complexity of Data Reviewed External Data Reviewed: notes. Labs: ordered. Decision-making details documented in ED Course.    Details: reviewed his prior labs he does have history of gonorrhea and chlamydia  Risk OTC drugs. Prescription drug management. Diagnosis or treatment significantly limited by social determinants of health.         Final Clinical Impression(s) / ED Diagnoses Final diagnoses:  Penile discharge  Concern about STD in male without diagnosis    Rx / DC Orders ED Discharge Orders     None         Kenley Rettinger A, PA-C 06/30/22 2051    Lawsing,  Fayrene Fearing, MD 06/30/22 2302

## 2022-06-30 NOTE — ED Provider Triage Note (Signed)
Emergency Medicine Provider Triage Evaluation Note  Ahmad Vanwey , a 26 y.o. male  was evaluated in triage.  Pt complains of requesting STD testing and treatment.  Sexually active.  Does not use protection all the time.  States yesterday developed some dysuria and some light yellow discharge from his penis.  No pain with bowel movements.  No swelling, redness or warmth to scrotum.  Has prior history of STDs.  Review of Systems  Positive: STD testing and treatment Negative:   Physical Exam  BP 131/72 (BP Location: Left Arm)   Pulse 66   Temp 98.7 F (37.1 C) (Oral)   Resp 16   SpO2 100%  Gen:   Awake, no distress   Resp:  Normal effort  MSK:   Moves extremities without difficulty  Other:    Medical Decision Making  Medically screening exam initiated at 4:10 PM.  Appropriate orders placed.  Amarius Toto was informed that the remainder of the evaluation will be completed by another provider, this initial triage assessment does not replace that evaluation, and the importance of remaining in the ED until their evaluation is complete.  STD eval   Sally Menard A, PA-C 06/30/22 1610

## 2022-06-30 NOTE — ED Triage Notes (Signed)
Pt reports white discharge from penis x 3 days. Pain w/ urination as well.

## 2022-06-30 NOTE — Discharge Instructions (Addendum)
You were tested and treated for STDs.  Your HIV or syphilis testing is positive you will be called and will need to seek treatment with infectious disease  Follow-up with the health  No sexual activity for at least 1 week after treatment  Return for new or worsening symptoms.

## 2022-06-30 NOTE — ED Notes (Signed)
Patient called for vitals x2 with no response and not visible in the lobby 

## 2022-07-01 LAB — RPR: RPR Ser Ql: NONREACTIVE

## 2022-08-13 ENCOUNTER — Other Ambulatory Visit: Payer: Self-pay

## 2022-08-13 ENCOUNTER — Encounter (HOSPITAL_COMMUNITY): Payer: Self-pay | Admitting: Emergency Medicine

## 2022-08-13 ENCOUNTER — Emergency Department (HOSPITAL_COMMUNITY)
Admission: EM | Admit: 2022-08-13 | Discharge: 2022-08-13 | Disposition: A | Discharge status: Home / Self Care | Payer: Medicaid Other | Attending: Emergency Medicine | Admitting: Emergency Medicine

## 2022-08-13 DIAGNOSIS — Z760 Encounter for issue of repeat prescription: Secondary | ICD-10-CM | POA: Insufficient documentation

## 2022-08-13 DIAGNOSIS — M25512 Pain in left shoulder: Secondary | ICD-10-CM | POA: Insufficient documentation

## 2022-08-13 DIAGNOSIS — M542 Cervicalgia: Secondary | ICD-10-CM | POA: Insufficient documentation

## 2022-08-13 MED ORDER — ALBUTEROL SULFATE HFA 108 (90 BASE) MCG/ACT IN AERS: 1.0000 | INHALATION_SPRAY | Freq: Four times a day (QID) | RESPIRATORY_TRACT | 0 refills | Status: AC | PRN

## 2022-08-13 MED ORDER — DICLOFENAC SODIUM 1 % EX GEL: 2.0000 g | Freq: Four times a day (QID) | CUTANEOUS | 0 refills | Status: AC

## 2022-08-13 MED ORDER — NAPROXEN 500 MG PO TABS: 500.0000 mg | ORAL_TABLET | Freq: Two times a day (BID) | ORAL | 0 refills | Status: AC

## 2022-08-13 NOTE — ED Provider Notes (Signed)
[facility] EMERGENCY DEPARTMENT Provider Note   CSN: [id number] Arrival date & time: 08/13/22  1904     History  Chief Complaint  Patient presents with   Neck/Shoulder Pain     Ross Butler is a 26 y.o. male who presents to the emergency department complaining of left sided shoulder pain. States he was at a hotel watching youtube on his phone when the frame on the wall fell, landing on his left shoulder.  States he is not having any difficulty ranging the joint.  Reports a "inflammatory condition", that he has been prescribed a few medications for.  He presents today with concern that this injury may exacerbate some muscle spasms and inflammation and he wants to prevent this.  Denies any other injury.  HPI     Home Medications Prior to Admission medications   Medication Sig Start Date End Date Taking? Authorizing Provider  albuterol (VENTOLIN HFA) 108 (90 Base) MCG/ACT inhaler Inhale 1-2 puffs into the lungs every 6 (six) hours as needed for wheezing or shortness of breath. 08/13/22  Yes Azara Gemme T, PA-C  diclofenac Sodium (VOLTAREN) 1 % GEL Apply 2 g topically 4 (four) times daily. 08/13/22  Yes Gitel Beste T, PA-C  benzonatate (TESSALON) 100 MG capsule Take 1 capsule (100 mg total) by mouth 3 (three) times daily as needed for cough. Patient not taking: Reported on 06/29/2022 09/10/21   Petrucelli, Pleas Koch, PA-C  doxycycline (VIBRAMYCIN) 100 MG capsule Take 1 capsule (100 mg total) by mouth 2 (two) times daily. Patient not taking: Reported on 06/29/2022 05/14/22   Petrucelli, Lelon Mast R, PA-C  fluticasone (FLONASE) 50 MCG/ACT nasal spray Place 1 spray into both nostrils daily. Patient not taking: Reported on 06/29/2022 09/10/21   Petrucelli, Lelon Mast R, PA-C  naproxen (NAPROSYN) 500 MG tablet Take 1 tablet (500 mg total) by mouth 2 (two) times daily. 08/13/22   Gerrica Cygan T, PA-C  oxyCODONE-acetaminophen (PERCOCET) 5-325 MG tablet Take 1 tablet  by mouth every 6 (six) hours as needed for severe pain. Patient not taking: Reported on 03/03/2020 02/19/19   Tyson Alias, MD      Allergies    Patient has no known allergies.    Review of Systems   Review of Systems  Musculoskeletal:  Positive for arthralgias.  All other systems reviewed and are negative.   Physical Exam Updated Vital Signs BP 116/67 (BP Location: Right Arm)   Pulse 86   Temp 98.2 F (36.8 C) (Oral)   Resp 18   SpO2 98%  Physical Exam Vitals and nursing note reviewed.  Constitutional:      Appearance: Normal appearance.  HENT:     Head: Normocephalic and atraumatic.  Eyes:     Conjunctiva/sclera: Conjunctivae normal.  Neck:     Comments: No cervical midline spinal tenderness to palpation.  Some left-sided muscular tenderness with palpation, without deformity.  No clavicular or shoulder deformity. Pulmonary:     Effort: Pulmonary effort is normal. No respiratory distress.  Musculoskeletal:     Cervical back: Full passive range of motion without pain and neck supple. No spinous process tenderness.  Skin:    General: Skin is warm and dry.  Neurological:     Mental Status: He is alert.  Psychiatric:        Mood and Affect: Mood normal.        Behavior: Behavior normal.     ED Results / Procedures / Treatments   Labs (all labs ordered are  listed, but only abnormal results are displayed) Labs Reviewed - No data to display  EKG None  Radiology No results found.  Procedures Procedures    Medications Ordered in ED Medications - No data to display  ED Course/ Medical Decision Making/ A&P                           Medical Decision Making Risk Prescription drug management.   Patient is otherwise healthy 26 year old male who presents emergency department complaining of a left-sided shoulder injury after a frame on a wall fell and hit him.  On exam patient has no deformities to palpation of his neck or shoulder.  He does have some  pain with palpation of the left lateral neck along the muscles.  No deformities of the clavicle.  Full range of motion of the shoulder without pain.  No numbness or tingling.  Discussed with the patient I have very low suspicion for acute fractures or dislocations, and I do not believe x-ray would be of benefit for him at this point.  He agrees with this.  Patient would like refills of his previous prescriptions including naproxen, Voltaren gel, and albuterol inhaler for intermittent wheezing.  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  Patient discharged in stable condition all questions answered.        Final Clinical Impression(s) / ED Diagnoses Final diagnoses:  Acute pain of left shoulder  Medication refill    Rx / DC Orders ED Discharge Orders          Ordered    naproxen (NAPROSYN) 500 MG tablet  2 times daily        08/13/22 1929    diclofenac Sodium (VOLTAREN) 1 % GEL  4 times daily        08/13/22 1929    albuterol (VENTOLIN HFA) 108 (90 Base) MCG/ACT inhaler  Every 6 hours PRN        08/13/22 1929           Portions of this report may have been transcribed using voice recognition software. Every effort was made to ensure accuracy; however, inadvertent computerized transcription errors may be present.    Jeanella Flattery 08/13/22 1943    Glyn Ade, MD 08/14/22 0020

## 2022-08-13 NOTE — Discharge Instructions (Addendum)
You were seen in the emergency department for left shoulder pain.  As we discussed, I don't believe you broke any bones and so we decided not to do x-rays. I've refilled your naproxen, voltaren gel, and inhaler.  I recommend following up with your PCP about your percocet prescription.

## 2022-08-13 NOTE — ED Triage Notes (Signed)
Patient reports a piece of picture frame fell on his left lateral neck/left shoulder this evening , reports pain with movement /palpation .

## 2023-01-20 ENCOUNTER — Telehealth: Payer: Self-pay

## 2023-01-20 NOTE — Telephone Encounter (Signed)
Mychart msg sent. AS, CMA
